# Patient Record
Sex: Female | Born: 1940 | Race: White | Hispanic: No | Marital: Married | Smoking: Former smoker
Health system: Southern US, Community
[De-identification: ages and names within clinical notes are randomized; demographics above are authoritative.]

## PROBLEM LIST (undated history)

## (undated) DIAGNOSIS — D649 Anemia, unspecified: Secondary | ICD-10-CM

## (undated) DIAGNOSIS — T4145XA Adverse effect of unspecified anesthetic, initial encounter: Secondary | ICD-10-CM

## (undated) DIAGNOSIS — R319 Hematuria, unspecified: Secondary | ICD-10-CM

## (undated) DIAGNOSIS — E039 Hypothyroidism, unspecified: Secondary | ICD-10-CM

## (undated) DIAGNOSIS — Z8249 Family history of ischemic heart disease and other diseases of the circulatory system: Secondary | ICD-10-CM

## (undated) DIAGNOSIS — Z9081 Acquired absence of spleen: Secondary | ICD-10-CM

## (undated) DIAGNOSIS — L259 Unspecified contact dermatitis, unspecified cause: Secondary | ICD-10-CM

## (undated) DIAGNOSIS — J40 Bronchitis, not specified as acute or chronic: Secondary | ICD-10-CM

## (undated) DIAGNOSIS — Z90710 Acquired absence of both cervix and uterus: Secondary | ICD-10-CM

## (undated) DIAGNOSIS — L219 Seborrheic dermatitis, unspecified: Secondary | ICD-10-CM

## (undated) DIAGNOSIS — C801 Malignant (primary) neoplasm, unspecified: Secondary | ICD-10-CM

## (undated) DIAGNOSIS — E7800 Pure hypercholesterolemia, unspecified: Secondary | ICD-10-CM

## (undated) DIAGNOSIS — M1990 Unspecified osteoarthritis, unspecified site: Secondary | ICD-10-CM

## (undated) DIAGNOSIS — E785 Hyperlipidemia, unspecified: Secondary | ICD-10-CM

## (undated) DIAGNOSIS — T8859XA Other complications of anesthesia, initial encounter: Secondary | ICD-10-CM

## (undated) HISTORY — DX: Malignant (primary) neoplasm, unspecified: C80.1

## (undated) HISTORY — DX: Family history of ischemic heart disease and other diseases of the circulatory system: Z82.49

## (undated) HISTORY — PX: FRACTURE SURGERY: SHX138

## (undated) HISTORY — DX: Acquired absence of spleen: Z90.81

## (undated) HISTORY — DX: Acquired absence of both cervix and uterus: Z90.710

## (undated) HISTORY — DX: Pure hypercholesterolemia, unspecified: E78.00

## (undated) HISTORY — DX: Hyperlipidemia, unspecified: E78.5

## (undated) HISTORY — DX: Anemia, unspecified: D64.9

## (undated) HISTORY — DX: Unspecified contact dermatitis, unspecified cause: L25.9

## (undated) HISTORY — DX: Hematuria, unspecified: R31.9

## (undated) HISTORY — PX: OTHER SURGICAL HISTORY: SHX169

## (undated) HISTORY — DX: Hypothyroidism, unspecified: E03.9

## (undated) HISTORY — PX: ABDOMINAL HYSTERECTOMY: SHX81

## (undated) HISTORY — DX: Seborrheic dermatitis, unspecified: L21.9

## (undated) HISTORY — PX: MOHS SURGERY: SUR867

## (undated) HISTORY — PX: MOUTH SURGERY: SHX715

## (undated) HISTORY — DX: Bronchitis, not specified as acute or chronic: J40

---
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
## 1990-11-05 DIAGNOSIS — Z90710 Acquired absence of both cervix and uterus: Secondary | ICD-10-CM

## 1990-11-05 HISTORY — DX: Acquired absence of both cervix and uterus: Z90.710

## 1991-11-06 HISTORY — PX: OTHER SURGICAL HISTORY: SHX169

## 1996-11-05 DIAGNOSIS — Z9081 Acquired absence of spleen: Secondary | ICD-10-CM

## 1996-11-05 HISTORY — DX: Acquired absence of spleen: Z90.81

## 2006-10-01 ENCOUNTER — Emergency Department: Payer: Self-pay | Admitting: Emergency Medicine

## 2006-10-04 ENCOUNTER — Ambulatory Visit (HOSPITAL_BASED_OUTPATIENT_CLINIC_OR_DEPARTMENT_OTHER): Admission: RE | Admit: 2006-10-04 | Discharge: 2006-10-05 | Payer: Self-pay | Admitting: Orthopedic Surgery

## 2009-02-11 DIAGNOSIS — E039 Hypothyroidism, unspecified: Secondary | ICD-10-CM

## 2009-02-11 HISTORY — DX: Hypothyroidism, unspecified: E03.9

## 2009-02-23 DIAGNOSIS — Z8249 Family history of ischemic heart disease and other diseases of the circulatory system: Secondary | ICD-10-CM

## 2009-02-23 HISTORY — DX: Family history of ischemic heart disease and other diseases of the circulatory system: Z82.49

## 2009-02-25 DIAGNOSIS — R319 Hematuria, unspecified: Secondary | ICD-10-CM

## 2009-02-25 HISTORY — DX: Hematuria, unspecified: R31.9

## 2010-03-01 DIAGNOSIS — J40 Bronchitis, not specified as acute or chronic: Secondary | ICD-10-CM

## 2010-03-01 HISTORY — DX: Bronchitis, not specified as acute or chronic: J40

## 2014-01-04 LAB — CBC AND DIFFERENTIAL
HCT: 46 % (ref 36–46)
Hemoglobin: 16.1 g/dL — AB (ref 12.0–16.0)
Platelets: 344 10*3/uL (ref 150–399)
WBC: 6.6 10^3/mL

## 2014-01-04 LAB — BASIC METABOLIC PANEL
BUN: 17 mg/dL (ref 4–21)
CREATININE: 1 mg/dL (ref <=1.1)
Glucose: 94 mg/dL
POTASSIUM: 4.3 mmol/L (ref 3.4–5.3)
SODIUM: 136 mmol/L — AB (ref 137–147)

## 2014-01-04 LAB — HEPATIC FUNCTION PANEL
ALT: 31 U/L (ref 7–35)
AST: 23 U/L (ref 13–35)

## 2014-08-09 LAB — TSH: TSH: 0.52 u[IU]/mL (ref <=5.90)

## 2015-08-04 ENCOUNTER — Encounter: Payer: Self-pay | Admitting: Family Medicine

## 2015-08-04 ENCOUNTER — Ambulatory Visit (INDEPENDENT_AMBULATORY_CARE_PROVIDER_SITE_OTHER): Payer: Medicare Other | Admitting: Family Medicine

## 2015-08-04 VITALS — BP 132/98 | HR 76 | Temp 97.5°F | Resp 16 | Ht 62.5 in | Wt 131.0 lb

## 2015-08-04 DIAGNOSIS — E039 Hypothyroidism, unspecified: Secondary | ICD-10-CM

## 2015-08-04 DIAGNOSIS — E2839 Other primary ovarian failure: Secondary | ICD-10-CM | POA: Diagnosis not present

## 2015-08-04 DIAGNOSIS — Z23 Encounter for immunization: Secondary | ICD-10-CM

## 2015-08-04 DIAGNOSIS — L219 Seborrheic dermatitis, unspecified: Secondary | ICD-10-CM | POA: Insufficient documentation

## 2015-08-04 DIAGNOSIS — Z0000 Encounter for general adult medical examination without abnormal findings: Secondary | ICD-10-CM | POA: Diagnosis not present

## 2015-08-04 DIAGNOSIS — Z1211 Encounter for screening for malignant neoplasm of colon: Secondary | ICD-10-CM | POA: Diagnosis not present

## 2015-08-04 DIAGNOSIS — E7800 Pure hypercholesterolemia, unspecified: Secondary | ICD-10-CM | POA: Insufficient documentation

## 2015-08-04 DIAGNOSIS — Z13820 Encounter for screening for osteoporosis: Secondary | ICD-10-CM | POA: Diagnosis not present

## 2015-08-04 DIAGNOSIS — D649 Anemia, unspecified: Secondary | ICD-10-CM | POA: Insufficient documentation

## 2015-08-04 DIAGNOSIS — I10 Essential (primary) hypertension: Secondary | ICD-10-CM | POA: Insufficient documentation

## 2015-08-04 NOTE — Progress Notes (Signed)
Patient: Jillian Mullins, Female    DOB: [birth year], 74 y.o.   MRN: [id number] Visit Date: 08/04/2015  Today's Provider: Margarita Rana, MD   Chief Complaint  Patient presents with  . Medicare Wellness  . Hypothyroidism   Subjective:    Annual wellness visit Jillian Mullins is a 74 y.o. female. She feels well. She reports exercising walks daily, plays golf. She reports she is sleeping well.  Reviewed health maintenance today. Has no acute concerns. Also checked on chronic issues today.    Hypothyroidism: Patient presents for evaluation of thyroid function. Symptoms consist of denies fatigue, weight changes, heat/cold intolerance, bowel/skin changes or CVS symptoms. Symptoms have present for 20 years. The symptoms are mild.  The problem has been unchanged.  Previous thyroid studies include TSH.  -----------------------------------------------------------   Review of Systems  Constitutional: Negative.   HENT: Negative.   Eyes: Negative.   Respiratory: Negative.   Cardiovascular: Negative.   Gastrointestinal: Negative.   Endocrine: Negative.   Genitourinary: Negative.   Musculoskeletal: Negative.   Skin: Negative.   Allergic/Immunologic: Negative.   Neurological: Negative.   Hematological: Negative.   Psychiatric/Behavioral: Negative.     Social History   Social History  . Marital Status: Married    Spouse Name: Kam Kushnir, Sr.  . Number of Children: 3  . Years of Education: 17   Occupational History  . RETIRED     [employer]   Social History Main Topics  . Smoking status: Former Smoker -- 0.25 packs/day for 20 years    Quit date: 11/05/1995  . Smokeless tobacco: Never Used  . Alcohol Use: 4.8 oz/week    8 Glasses of wine per week     Comment: Moderate alcohol use; Has cut back and is not a concern  . Drug Use: No  . Sexual Activity: Not on file   Other Topics Concern  . Not on file   Social History Narrative    Patient Active Problem List   Diagnosis Date Noted  . Absolute anemia 08/04/2015  . BP (high blood pressure) 08/04/2015  . Hypercholesteremia 08/04/2015  . Dermatitis seborrheica 08/04/2015  . Bronchitis 03/01/2010  . CD (contact dermatitis) 02/25/2009  . Blood in the urine 02/25/2009  . HLD (hyperlipidemia) 02/11/2009  . Adult hypothyroidism 02/11/2009  . 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 H/O total hysterectomy 11/05/1990    Past Surgical History  Procedure Laterality Date  . Splenectomy  1960  . Hysterectomy and bso  1993  . Mouth surgery      Removed all wisdom teeth  . Fracture surgery      Wrist fracture repair  . Colon polyp removal      Colonoscopy: 1970  . Abdominal hysterectomy      Her family history includes COPD in her sister; Heart attack in her father; Heart failure in her mother.    Previous Medications   LEVOTHYROXINE (SYNTHROID) 88 MCG TABLET    Take 1 tablet by mouth daily.    Patient Care Team: Margarita Rana, MD as PCP - General (Family Medicine)     Objective:   Vitals: BP 132/98 mmHg  Pulse 76  Temp(Src) 97.5 F (36.4 C) (Oral)  Resp 16  Ht 5' 2.5" (1.588 m)  Wt 131 lb (59.421 kg)  BMI 23.56 kg/m2  Physical Exam  Constitutional: She is oriented to person, place, and time. She appears well-developed and well-nourished. No distress.  HENT:  Head: Normocephalic and atraumatic.  Right Ear:  External ear normal.  Left Ear: External ear normal.  Nose: Nose normal.  Mouth/Throat: Oropharynx is clear and moist. No oropharyngeal exudate.  Eyes: Conjunctivae and EOM are normal. Pupils are equal, round, and reactive to light. Right eye exhibits no discharge. Left eye exhibits no discharge. No scleral icterus.  Neck: Normal range of motion. Neck supple. No JVD present. No tracheal deviation present. No thyromegaly present.  Cardiovascular: Normal rate, regular rhythm, normal heart sounds and intact distal pulses.  Exam reveals no gallop and no friction rub.   No murmur  heard. Pulmonary/Chest: Effort normal and breath sounds normal. No stridor. No respiratory distress. She has no wheezes. She has no rales. She exhibits no tenderness.  Abdominal: Soft. Bowel sounds are normal. She exhibits no distension and no mass. There is no tenderness. There is no rebound and no guarding.  Musculoskeletal: Normal range of motion. She exhibits no edema or tenderness.  Lymphadenopathy:    She has no cervical adenopathy.  Neurological: She is alert and oriented to person, place, and time. She has normal reflexes. She displays normal reflexes. No cranial nerve deficit. She exhibits normal muscle tone. Coordination normal.  Skin: Skin is warm and dry. No rash noted. She is not diaphoretic. No erythema. No pallor.  Psychiatric: She has a normal mood and affect. Her behavior is normal. Judgment and thought content normal.    Activities of Daily Living In your present state of health, do you have any difficulty performing the following activities: 08/04/2015  Hearing? N  Vision? N  Difficulty concentrating or making decisions? N  Walking or climbing stairs? N  Dressing or bathing? N  Doing errands, shopping? N    Fall Risk Assessment Fall Risk  08/04/2015  Falls in the past year? No     Depression Screen PHQ 2/9 Scores 08/04/2015  PHQ - 2 Score 0    Cognitive Testing - 6-CIT  Correct? Score   What year is it? yes 0 0 or 4  What month is it? yes 0 0 or 3  Memorize:    Jillian Mullins,  [birth year],  [address]  [place],      What time is it? (within 1 hour) yes 0 0 or 3  Count backwards from 20 yes 0 0, 2, or 4  Name the months of the year yes 0 0, 2, or 4  Repeat name & address above yes 0 0, 2, 4, 6, 8, or 10       TOTAL SCORE  0/28   Interpretation:  Normal  Normal (0-7) Abnormal (8-28)       Assessment & Plan:     Annual Wellness Visit  Reviewed patient's Family Medical History Reviewed and updated list of patient's medical providers Assessment of cognitive  impairment was done Assessed patient's functional ability Established a written schedule for health screening [place] Completed and Reviewed  Exercise Activities and Dietary recommendations Goals    None      Immunization History  Administered Date(s) Administered  . Influenza, High Dose Seasonal PF 08/04/2015  . Pneumococcal Conjugate-13 01/04/2014  . Pneumococcal-Unspecified 04/02/1995, 09/19/2005  . Tdap 07/18/2006    There are no preventive care reminders to display for this patient.    Discussed health benefits of physical activity, and encouraged her to engage in regular exercise appropriate for her age and condition.    ------------------------------------------------------------------------------------------------------------  1. Hypothyroidism, unspecified hypothyroidism type Stable. FU pending results. Continue current medication. Will refill as needed.  -  TSH  2. Hypercholesteremia Pt has H/O this; FU pending results. - Comprehensive metabolic panel - CBC with Differential/Platelet - Lipid panel  3. Flu vaccine need Flu vaccine administered.  - Flu vaccine HIGH DOSE PF  4. Colon cancer screening Refer to GI for colonoscopy. - Ambulatory referral to Gastroenterology  5. Encounter for screening for osteoporosis FU pending BMD. - DG Bone Density; Future  6. Estrogen deficiency FU pending BMD. - DG Bone Density; Future   7. Medicare annual wellness visit, subsequent As above. Stable.   Patient was seen and examined by Jerrell Belfast, MD, and note scribed by Renaldo Fiddler, CMA. I have reviewed the document for accuracy and completeness and I agree with above. Jerrell Belfast, MD   Margarita Rana, MD

## 2015-08-05 ENCOUNTER — Telehealth: Payer: Self-pay

## 2015-08-05 LAB — LIPID PANEL
CHOL/HDL RATIO: 4 ratio (ref 0.0–4.4)
CHOLESTEROL TOTAL: 320 mg/dL — AB (ref 100–199)
HDL: 80 mg/dL (ref >39)
LDL Calculated: 215 mg/dL — ABNORMAL HIGH (ref 0–99)
Triglycerides: 127 mg/dL (ref 0–149)
VLDL Cholesterol Cal: 25 mg/dL (ref 5–40)

## 2015-08-05 LAB — CBC WITH DIFFERENTIAL/PLATELET
BASOS ABS: 0.1 10*3/uL (ref 0.0–0.2)
BASOS: 2 %
EOS (ABSOLUTE): 0.3 10*3/uL (ref 0.0–0.4)
Eos: 5 %
Hematocrit: 47.7 % — ABNORMAL HIGH (ref 34.0–46.6)
Hemoglobin: 16.3 g/dL — ABNORMAL HIGH (ref 11.1–15.9)
IMMATURE GRANULOCYTES: 0 %
Immature Grans (Abs): 0 10*3/uL (ref 0.0–0.1)
Lymphocytes Absolute: 2.2 10*3/uL (ref 0.7–3.1)
Lymphs: 42 %
MCH: 32.7 pg (ref 26.6–33.0)
MCHC: 34.2 g/dL (ref 31.5–35.7)
MCV: 96 fL (ref 79–97)
MONOS ABS: 0.4 10*3/uL (ref 0.1–0.9)
Monocytes: 8 %
NEUTROS PCT: 43 %
Neutrophils Absolute: 2.3 10*3/uL (ref 1.4–7.0)
PLATELETS: 320 10*3/uL (ref 150–379)
RBC: 4.99 x10E6/uL (ref 3.77–5.28)
RDW: 13.9 % (ref 12.3–15.4)
WBC: 5.4 10*3/uL (ref 3.4–10.8)

## 2015-08-05 LAB — COMPREHENSIVE METABOLIC PANEL
A/G RATIO: 1.6 (ref 1.1–2.5)
ALK PHOS: 127 IU/L — AB (ref 39–117)
ALT: 25 IU/L (ref 0–32)
AST: 25 IU/L (ref 0–40)
Albumin: 4.3 g/dL (ref 3.5–4.8)
BILIRUBIN TOTAL: 0.5 mg/dL (ref 0.0–1.2)
BUN/Creatinine Ratio: 18 (ref 11–26)
BUN: 18 mg/dL (ref 8–27)
CALCIUM: 9.9 mg/dL (ref 8.7–10.3)
CHLORIDE: 101 mmol/L (ref 97–108)
CO2: 22 mmol/L (ref 18–29)
Creatinine, Ser: 0.99 mg/dL (ref 0.57–1.00)
GFR calc Af Amer: 66 mL/min/{1.73_m2} (ref >59)
GFR, EST NON AFRICAN AMERICAN: 57 mL/min/{1.73_m2} — AB (ref >59)
GLOBULIN, TOTAL: 2.7 g/dL (ref 1.5–4.5)
Glucose: 94 mg/dL (ref 65–99)
POTASSIUM: 4.8 mmol/L (ref 3.5–5.2)
SODIUM: 140 mmol/L (ref 134–144)
Total Protein: 7 g/dL (ref 6.0–8.5)

## 2015-08-05 LAB — TSH: TSH: 0.075 u[IU]/mL — AB (ref 0.450–4.500)

## 2015-08-05 NOTE — Telephone Encounter (Signed)
LMTCB Emily Drozdowski, CMA  

## 2015-08-05 NOTE — Telephone Encounter (Signed)
-----   Message from Margarita Rana, MD sent at 08/05/2015  1:06 PM EDT ----- Labs stable except thyroid slightly over corrected. Not enough to make any changes unless having symptoms of hyperthyroid like palpitations or anxiety. Also, cholesterol is very high at 320 with LDL or back cholesterol of 215. Is balanced by good at 80. 10 year risk of heart disease is 11 percent and treatment with statin is recommended. Please see if patient would like to start medication. Also,   hgb is mildly elevated. Stay well hydrated and recheck TSH, T4 and CBC in 6 weeks.  Thanks.

## 2015-08-08 NOTE — Telephone Encounter (Signed)
Advised pt of lab results. Pt verbally acknowledges understanding. Pt does not wish to start Statin at this time. Renaldo Fiddler, CMA

## 2015-08-11 ENCOUNTER — Ambulatory Visit
Admission: RE | Admit: 2015-08-11 | Discharge: 2015-08-11 | Disposition: A | Discharge status: Home / Self Care | Payer: Medicare Other | Source: Ambulatory Visit | Attending: Family Medicine | Admitting: Family Medicine

## 2015-08-11 DIAGNOSIS — M810 Age-related osteoporosis without current pathological fracture: Secondary | ICD-10-CM | POA: Insufficient documentation

## 2015-08-11 DIAGNOSIS — M8580 Other specified disorders of bone density and structure, unspecified site: Secondary | ICD-10-CM | POA: Diagnosis not present

## 2015-08-11 DIAGNOSIS — E2839 Other primary ovarian failure: Secondary | ICD-10-CM

## 2015-08-11 DIAGNOSIS — Z13820 Encounter for screening for osteoporosis: Secondary | ICD-10-CM | POA: Insufficient documentation

## 2015-08-12 ENCOUNTER — Telehealth: Payer: Self-pay

## 2015-08-12 NOTE — Telephone Encounter (Signed)
Pt returned Laura's call. Thanks TNPp m

## 2015-08-12 NOTE — Telephone Encounter (Signed)
LMTCB 08/12/2015  Thanks,   -Mickel Baas

## 2015-08-12 NOTE — Telephone Encounter (Signed)
-----   Message from Margarita Rana, MD sent at 08/12/2015  1:24 PM EDT ----- Osteoporosis. OV to address. Not emergent. Would be better to have in about 4 weeks because may be some other vaccines needed secondary to history of splenectomy.  Thanks.

## 2015-08-12 NOTE — Telephone Encounter (Signed)
Pt advised.  Made apt for 09/12/2015   Thanks,   -Mickel Baas

## 2015-08-18 ENCOUNTER — Telehealth: Payer: Self-pay

## 2015-08-18 ENCOUNTER — Other Ambulatory Visit: Payer: Self-pay

## 2015-08-18 NOTE — Telephone Encounter (Signed)
Gastroenterology Pre-Procedure Review  Request Date: 01/10/16 Requesting Physician: Dr.   PATIENT REVIEW QUESTIONS: The patient responded to the following health history questions as indicated:    1. Are you having any GI issues? no 2. Do you have a personal history of Polyps? no 3. Do you have a family history of Colon Cancer or Polyps? no 4. Diabetes Mellitus? no 5. Joint replacements in the past 12 months?no 6. Major health problems in the past 3 months?no 7. Any artificial heart valves, MVP, or defibrillator?no    MEDICATIONS & ALLERGIES:    Patient reports the following regarding taking any anticoagulation/antiplatelet therapy:   Plavix, Coumadin, Eliquis, Xarelto, Lovenox, Pradaxa, Brilinta, or Effient? no Aspirin? no  Patient confirms/reports the following medications:  Current Outpatient Prescriptions  Medication Sig Dispense Refill  . levothyroxine (SYNTHROID) 88 MCG tablet Take 1 tablet by mouth daily.     No current facility-administered medications for this visit.    Patient confirms/reports the following allergies:  Allergies  Allergen Reactions  . Tetracycline     No orders of the defined types were placed in this encounter.    AUTHORIZATION INFORMATION Primary Insurance: 1D#: Group #:  Secondary Insurance: 1D#: Group #:  SCHEDULE INFORMATION: Date:01/20/16  Time: Location: [facility]

## 2015-08-24 ENCOUNTER — Other Ambulatory Visit: Payer: Self-pay

## 2015-09-12 ENCOUNTER — Ambulatory Visit (INDEPENDENT_AMBULATORY_CARE_PROVIDER_SITE_OTHER): Payer: Medicare Other | Admitting: Family Medicine

## 2015-09-12 ENCOUNTER — Encounter: Payer: Self-pay | Admitting: Family Medicine

## 2015-09-12 VITALS — BP 112/80 | HR 60 | Temp 98.0°F | Resp 16 | Wt 133.0 lb

## 2015-09-12 DIAGNOSIS — Z23 Encounter for immunization: Secondary | ICD-10-CM | POA: Diagnosis not present

## 2015-09-12 DIAGNOSIS — M810 Age-related osteoporosis without current pathological fracture: Secondary | ICD-10-CM | POA: Diagnosis not present

## 2015-09-12 DIAGNOSIS — E559 Vitamin D deficiency, unspecified: Secondary | ICD-10-CM | POA: Diagnosis not present

## 2015-09-12 DIAGNOSIS — Z9081 Acquired absence of spleen: Secondary | ICD-10-CM | POA: Diagnosis not present

## 2015-09-12 MED ORDER — ALENDRONATE SODIUM 70 MG PO TABS: 70.0000 mg | ORAL_TABLET | ORAL | Status: DC

## 2015-09-12 MED ORDER — VITAMIN D 1000 UNITS PO TABS: 1000.0000 [IU] | ORAL_TABLET | Freq: Every day | ORAL | Status: DC

## 2015-09-12 NOTE — Progress Notes (Signed)
Subjective:    Patient ID: Jillian Mullins, female    DOB: [birth year], 74 y.o.   MRN: [id number]  HPI  Osteoporosis: Patient complains of osteoporosis. She was diagnosed with osteoporosis by bone density scan in 08/11/2015. Patient admits to history of fracture.The cause of osteoporosis is felt to be due to postmenopausal estrogen deficiency.   She is not currently being treated with calcium and vitamin D supplementation.  She is not currently being treated with bisphosphonates  Osteoporosis Risk Factors  Nonmodifiable Personal Hx of fracture as an adult: yes - after fall 6-7 years ago. Fell down stairs.   Hx of fracture in first-degree relative: yes - sister Caucasian race: yes Advanced age: yes Female sex: yes Dementia: no Poor health/frailty: no  Potentially modifiable: Tobacco use: no Low body weight (<127 lbs): no Estrogen deficiency  early menopause (age <45) or bilateral ovariectomy: no  prolonged premenopausal amenorrhea (>1 yr): not applicable Low calcium intake (lifelong): no Alcoholism: no Recurrent falls: no Inadequate physical activity: no  Immunizations Pt needs meningitis vaccines due to s/p splenectomy. Also needs Hib at follow up.   Review of Systems  Constitutional: Negative for fever, chills, diaphoresis, activity change, appetite change, fatigue and unexpected weight change.  Respiratory: Negative for cough, shortness of breath and wheezing.   Cardiovascular: Negative for chest pain, palpitations and leg swelling.   BP 112/80 mmHg  Pulse 60  Temp(Src) 98 F (36.7 C) (Oral)  Resp 16  Wt 133 lb (60.328 kg)   Patient Active Problem List   Diagnosis Date Noted  . Absolute anemia 08/04/2015  . BP (high blood pressure) 08/04/2015  . Hypercholesteremia 08/04/2015  . Dermatitis seborrheica 08/04/2015  . Bronchitis 03/01/2010  . CD (contact dermatitis) 02/25/2009  . Blood in the urine 02/25/2009  . HLD (hyperlipidemia) 02/11/2009  . Adult  hypothyroidism 02/11/2009  . 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 H/O total hysterectomy 11/05/1990   Past Medical History  Diagnosis Date  . Anemia   . Hypothyroidism 02/11/2009  . Hypercholesterolemia   . Hyperlipidemia   . Bronchitis 03/01/2010  . Seborrheic dermatitis   . Contact dermatitis   . Hematuria 02/25/2009  . Family history of cardiovascular disease 02/23/2009    (father MI 73 yo)  . 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 Post-splenectomy   . 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 levothyroxine (SYNTHROID) 88 MCG tablet Take 1 tablet by mouth daily.   No current facility-administered medications on file prior to visit.   Allergies  Allergen Reactions  . Tetracycline    Past Surgical History  Procedure Laterality Date  . Splenectomy  1960  . Hysterectomy and bso  1993  . Mouth surgery      Removed all wisdom teeth  . Fracture surgery      Wrist fracture repair  . Colon polyp removal      Colonoscopy: 1970  . Abdominal hysterectomy     Social History   Social History  . Marital Status: Married    Spouse Name: Cassity Christian, Sr.  . Number of Children: 3  . Years of Education: 17   Occupational History  . RETIRED     [employer]   Social History Main Topics  . Smoking status: Former Smoker -- 0.25 packs/day for 20 years    Quit date: 11/05/1995  . Smokeless tobacco: Never Used  . Alcohol Use: 4.8  oz/week    8 Glasses of wine per week     Comment: Moderate alcohol use; Has cut back and is not a concern  . Drug Use: No  . Sexual Activity: Not on file   Other Topics Concern  . Not on file   Social History Narrative   Family History  Problem Relation Age of Onset  . Heart failure Mother   . Heart attack Father   . COPD Sister     in her 49's      Objective:   Physical Exam  Constitutional: She is oriented to person, place, and time. She appears  well-developed and well-nourished.  Neurological: She is alert and oriented to person, place, and time.  Psychiatric: She has a normal mood and affect. Her behavior is normal. Judgment and thought content normal.   BP 112/80 mmHg  Pulse 60  Temp(Src) 98 F (36.7 C) (Oral)  Resp 16  Wt 133 lb (60.328 kg)     Assessment & Plan:  1. History of splenectomy Shots given today. Needs Men booster and Hib in 8 weeks.   - Meningococcal B, OMV - Meningococcal conjugate vaccine 4-valent IM  2. Osteoporosis 10 year risk of hip fracture is 4.9 percent and major fracture is 21 percent. Discussed risks and benefits of Fosamax. Does not need dental work. Instructed on how to take medication and importance of this. Will recheck labs in 6 weeks.    - alendronate (FOSAMAX) 70 MG tablet; Take 1 tablet (70 mg total) by mouth every 7 (seven) days. Take with a full glass of water on an empty stomach.  Dispense: 4 tablet; Refill: 11  3. Vitamin D deficiency Will start taking Vitamin D.   Margarita Rana, MD

## 2015-10-25 ENCOUNTER — Other Ambulatory Visit: Payer: Self-pay

## 2015-10-25 DIAGNOSIS — E039 Hypothyroidism, unspecified: Secondary | ICD-10-CM

## 2015-10-25 MED ORDER — LEVOTHYROXINE SODIUM 88 MCG PO TABS: 88.0000 ug | ORAL_TABLET | Freq: Every day | ORAL | Status: DC

## 2015-11-18 ENCOUNTER — Ambulatory Visit: Payer: Medicare Other | Admitting: Family Medicine

## 2015-12-09 ENCOUNTER — Ambulatory Visit: Payer: Medicare Other | Admitting: Family Medicine

## 2015-12-14 ENCOUNTER — Ambulatory Visit (INDEPENDENT_AMBULATORY_CARE_PROVIDER_SITE_OTHER): Payer: Medicare PPO | Admitting: Family Medicine

## 2015-12-14 DIAGNOSIS — Z9081 Acquired absence of spleen: Secondary | ICD-10-CM | POA: Diagnosis not present

## 2015-12-14 NOTE — Progress Notes (Signed)
Shot only. Thanks.

## 2015-12-30 ENCOUNTER — Telehealth: Payer: Self-pay | Admitting: Gastroenterology

## 2015-12-30 NOTE — Telephone Encounter (Signed)
Patient is moving and needs to change her date for colonoscopy

## 2015-12-30 NOTE — Telephone Encounter (Signed)
LVM for pt to return my call.

## 2016-01-02 ENCOUNTER — Other Ambulatory Visit: Payer: Self-pay

## 2016-01-10 ENCOUNTER — Encounter: Admission: RE | Payer: Self-pay | Source: Ambulatory Visit

## 2016-01-10 ENCOUNTER — Encounter: Payer: Self-pay | Admitting: *Deleted

## 2016-01-10 ENCOUNTER — Ambulatory Visit: Admission: RE | Admit: 2016-01-10 | Payer: Medicare PPO | Source: Ambulatory Visit | Admitting: Gastroenterology

## 2016-01-10 SURGERY — COLONOSCOPY WITH PROPOFOL
Anesthesia: Choice

## 2016-01-10 SURGERY — COLONOSCOPY WITH PROPOFOL
Anesthesia: General

## 2016-01-11 NOTE — Telephone Encounter (Signed)
Pt is in the middle of a move. Will call back when all is settled to schedule.

## 2016-06-08 ENCOUNTER — Other Ambulatory Visit: Payer: Self-pay | Admitting: Family Medicine

## 2016-06-08 DIAGNOSIS — E039 Hypothyroidism, unspecified: Secondary | ICD-10-CM

## 2016-07-17 ENCOUNTER — Other Ambulatory Visit: Payer: Self-pay | Admitting: Family Medicine

## 2016-07-17 DIAGNOSIS — E039 Hypothyroidism, unspecified: Secondary | ICD-10-CM

## 2016-07-17 MED ORDER — SYNTHROID 88 MCG PO TABS: 88.0000 ug | ORAL_TABLET | Freq: Every day | ORAL | 1 refills | Status: DC

## 2016-07-17 NOTE — Telephone Encounter (Signed)
Pt needs refill on her synthroid.  She uses [facility]  Her call back is I [phone]  Thanks Con Memos

## 2016-07-17 NOTE — Telephone Encounter (Signed)
Dr. Alben Spittle Pt.  Thanks,   -Mickel Baas

## 2016-07-18 DIAGNOSIS — H2513 Age-related nuclear cataract, bilateral: Secondary | ICD-10-CM | POA: Diagnosis not present

## 2016-08-07 ENCOUNTER — Encounter: Payer: Medicare PPO | Admitting: Family Medicine

## 2016-08-09 DIAGNOSIS — H2513 Age-related nuclear cataract, bilateral: Secondary | ICD-10-CM | POA: Diagnosis not present

## 2016-08-23 DIAGNOSIS — Z23 Encounter for immunization: Secondary | ICD-10-CM | POA: Diagnosis not present

## 2016-10-10 ENCOUNTER — Ambulatory Visit (INDEPENDENT_AMBULATORY_CARE_PROVIDER_SITE_OTHER): Payer: Medicare PPO | Admitting: Family Medicine

## 2016-10-10 ENCOUNTER — Encounter: Payer: Self-pay | Admitting: Family Medicine

## 2016-10-10 VITALS — BP 148/84 | HR 74 | Temp 97.5°F | Resp 16 | Ht 62.0 in | Wt 132.0 lb

## 2016-10-10 DIAGNOSIS — Z0000 Encounter for general adult medical examination without abnormal findings: Secondary | ICD-10-CM

## 2016-10-10 DIAGNOSIS — E7800 Pure hypercholesterolemia, unspecified: Secondary | ICD-10-CM

## 2016-10-10 DIAGNOSIS — S60519D Abrasion of unspecified hand, subsequent encounter: Secondary | ICD-10-CM | POA: Diagnosis not present

## 2016-10-10 DIAGNOSIS — Z9081 Acquired absence of spleen: Secondary | ICD-10-CM

## 2016-10-10 DIAGNOSIS — I10 Essential (primary) hypertension: Secondary | ICD-10-CM | POA: Diagnosis not present

## 2016-10-10 DIAGNOSIS — Z23 Encounter for immunization: Secondary | ICD-10-CM

## 2016-10-10 DIAGNOSIS — Z1211 Encounter for screening for malignant neoplasm of colon: Secondary | ICD-10-CM

## 2016-10-10 LAB — IFOBT (OCCULT BLOOD): IMMUNOLOGICAL FECAL OCCULT BLOOD TEST: NEGATIVE

## 2016-10-10 NOTE — Progress Notes (Signed)
Patient: Jillian Mullins, Female    DOB: 06/10/1941, 75 y.o.   MRN: [id number] Visit Date: 10/10/2016  Today's Provider: Wilhemena Durie, MD   Chief Complaint  Patient presents with  . Medicare Wellness   Subjective:   Jillian Mullins is a 75 y.o. female who presents today for her Subsequent Annual Wellness Visit. She feels well. She reports exercising daily. She reports she is sleeping well.  Colonoscopy- was scheduled for 01/10/16 but pt cancelled. BMD- 08/11/15 some osteoporosis Mammogram-1996, pt does not usually get these as she is not a big believer.  Pap- pt has had hysterectomy  Immunization History  Administered Date(s) Administered  . Influenza, High Dose Seasonal PF 08/04/2015  . Meningococcal B, OMV 09/12/2015, 12/14/2015  . Meningococcal Conjugate 09/12/2015  . Pneumococcal Conjugate-13 01/04/2014  . Pneumococcal Polysaccharide-23 04/02/1995, 09/19/2005  . Tdap 07/18/2006   Pt has h/o splenectomy and will need a Td and Hib (per Dr. Sharyon Medicus last note, was not given). Pt had flu vaccine at pharmacy at the beginning of the fall.   Review of Systems  Constitutional: Negative.   HENT: Negative.   Eyes: Negative.   Respiratory: Negative.   Cardiovascular: Negative.   Gastrointestinal: Negative.   Endocrine: Negative.   Genitourinary: Negative.   Musculoskeletal: Negative.   Skin: Negative.   Allergic/Immunologic: Negative.   Neurological: Negative.   Hematological: Negative.   Psychiatric/Behavioral: Negative.     Patient Active Problem List   Diagnosis Date Noted  . Absolute anemia 08/04/2015  . BP (high blood pressure) 08/04/2015  . Hypercholesteremia 08/04/2015  . Dermatitis seborrheica 08/04/2015  . Bronchitis 03/01/2010  . CD (contact dermatitis) 02/25/2009  . Blood in the urine 02/25/2009  . HLD (hyperlipidemia) 02/11/2009  . Adult hypothyroidism 02/11/2009  . 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 H/O total hysterectomy 11/05/1990    Social History    Social History  . Marital status: Married    Spouse name: Lucindia Lathe, Sr.  . Number of children: 3  . Years of education: 45   Occupational History  . RETIRED     [employer]   Social History Main Topics  . Smoking status: Former Smoker    Packs/day: 0.25    Years: 20.00    Quit date: 11/05/1995  . Smokeless tobacco: Never Used  . Alcohol use 4.8 oz/week    8 Glasses of wine per week     Comment: Moderate alcohol use; Has cut back and is not a concern  . Drug use: No  . Sexual activity: Not on file   Other Topics Concern  . Not on file   Social History Narrative  . No narrative on file    Past Surgical History:  Procedure Laterality Date  . ABDOMINAL HYSTERECTOMY    . Colon Polyp Removal     Colonoscopy: 1970  . FRACTURE SURGERY     Wrist fracture repair  . Hysterectomy and BSO  1993  . MOUTH SURGERY     Removed all wisdom teeth  . SPLENECTOMY  1960    Her family history includes COPD in her sister; Heart attack in her father; Heart failure in her mother.     Outpatient Medications Prior to Visit  Medication Sig Dispense Refill  . aspirin 325 MG tablet Take 325 mg by mouth 2 (two) times a week. Not consistent, may take 4 a week.    Marland Kitchen SYNTHROID 88 MCG tablet Take 1 tablet (88 mcg total) by mouth daily. 90 tablet 1  .  alendronate (FOSAMAX) 70 MG tablet Take 1 tablet (70 mg total) by mouth every 7 (seven) days. Take with a full glass of water on an empty stomach. (Patient not taking: Reported on 10/10/2016) 4 tablet 11  . cholecalciferol (VITAMIN D) 1000 UNITS tablet Take 1 tablet (1,000 Units total) by mouth daily. (Patient not taking: Reported on 10/10/2016) 1 tablet 0   No facility-administered medications prior to visit.     Allergies  Allergen Reactions  . Tetracycline     Patient Care Team: Jerrol Banana., MD as PCP - General (Family Medicine)   Objective:   Vitals:  Vitals:   10/10/16 1041  BP: (!) 148/84  Pulse: 74  Resp: 16  Temp: 97.5  F (36.4 C)  TempSrc: Oral  Weight: 132 lb (59.9 kg)  Height: 5\' 2"  (1.575 m)    Physical Exam  Constitutional: She is oriented to person, place, and time. She appears well-developed and well-nourished.  HENT:  Head: Normocephalic and atraumatic.  Right Ear: External ear normal.  Left Ear: External ear normal.  Nose: Nose normal.  Mouth/Throat: Oropharynx is clear and moist.  Eyes: Conjunctivae and EOM are normal. Pupils are equal, round, and reactive to light.  Neck: Normal range of motion. Neck supple.  Cardiovascular: Normal rate, normal heart sounds and intact distal pulses.   Pulmonary/Chest: Effort normal and breath sounds normal.  Normal breast exam.  Abdominal: Soft. Bowel sounds are normal.  Genitourinary: Rectal exam shows guaiac negative stool.  Genitourinary Comments: Normal external genitalia. DRE normal.  Musculoskeletal: Normal range of motion.  Neurological: She is alert and oriented to person, place, and time. She has normal reflexes.  Skin: Skin is warm and dry.  Psychiatric: She has a normal mood and affect. Her behavior is normal. Judgment and thought content normal.    Activities of Daily Living In your present state of health, do you have any difficulty performing the following activities: 10/10/2016  Hearing? N  Vision? Y  Difficulty concentrating or making decisions? N  Walking or climbing stairs? N  Dressing or bathing? N  Doing errands, shopping? N  Some recent data might be hidden    Fall Risk Assessment Fall Risk  10/10/2016 08/04/2015  Falls in the past year? No No     Depression Screen PHQ 2/9 Scores 10/10/2016 08/04/2015  PHQ - 2 Score 0 0    Cognitive Testing - 6-CIT    Year: 0 points  Month: 0 points  Memorize "Pia Mau, [address], [place]"  Time (within 1 hour:) 0 points  Count backwards from 20: 1 points  Name months of year: 0 points  Repeat Address: 2 points   Total Score: 3 /28  Interpretation : Normal (0-7) Abnormal  (8-28)    Assessment & Plan:     Annual Wellness Visit  Reviewed patient's Family Medical History Reviewed and updated list of patient's medical providers Assessment of cognitive impairment was done Assessed patient's functional ability Established a written schedule for health screening [place] Completed and Reviewed  Exercise Activities and Dietary recommendations Goals    None      Immunization History  Administered Date(s) Administered  . Influenza, High Dose Seasonal PF 08/04/2015  . Meningococcal B, OMV 09/12/2015, 12/14/2015  . Meningococcal Conjugate 09/12/2015  . Pneumococcal Conjugate-13 01/04/2014  . Pneumococcal Polysaccharide-23 04/02/1995, 09/19/2005  . Tdap 07/18/2006    Health Maintenance  Topic Date Due  . MAMMOGRAM  10/03/1992  . COLONOSCOPY  10/03/1992  .  ZOSTAVAX  10/03/2002  . PNA vac Low Risk Adult (2 of 2 - PPSV23) 01/05/2015  . INFLUENZA VACCINE  06/05/2016  . TETANUS/TDAP  07/18/2016  . DEXA SCAN  Completed    Pt  Has declined most health maintenance through the years.She continues to decline these--no mammogram or colonoscopy. Discussed health benefits of physical activity, and encouraged her to engage in regular exercise appropriate for her age and condition.   1. Medicare annual wellness visit, subsequent   2. Hypertension, unspecified type  - CBC with Differential/Platelet - TSH  3. Pure hypercholesterolemia  - Lipid Panel With LDL/HDL Ratio - Comprehensive metabolic panel  4. Need for TD vaccine  - Td : Tetanus/diphtheria >7yo Preservative  free  5. H/O splenectomy Td given today. Pt UTD on other vaccines except for Hib, which we do not have in the office. Plan to order a box and call pt to have the vaccine in office.  6.Abrasions on hands Update tetanus. Miguel Aschoff MD [facility] 10/10/2016 10:54  AM  ------------------------------------------------------------------------------------------------------------

## 2016-10-11 LAB — CBC WITH DIFFERENTIAL/PLATELET
BASOS ABS: 0.1 10*3/uL (ref 0.0–0.2)
BASOS: 2 %
EOS (ABSOLUTE): 0.2 10*3/uL (ref 0.0–0.4)
Eos: 3 %
Hematocrit: 47.1 % — ABNORMAL HIGH (ref 34.0–46.6)
Hemoglobin: 16.5 g/dL — ABNORMAL HIGH (ref 11.1–15.9)
IMMATURE GRANULOCYTES: 0 %
Immature Grans (Abs): 0 10*3/uL (ref 0.0–0.1)
Lymphocytes Absolute: 2.1 10*3/uL (ref 0.7–3.1)
Lymphs: 35 %
MCH: 33.6 pg — ABNORMAL HIGH (ref 26.6–33.0)
MCHC: 35 g/dL (ref 31.5–35.7)
MCV: 96 fL (ref 79–97)
MONOS ABS: 0.6 10*3/uL (ref 0.1–0.9)
Monocytes: 10 %
NEUTROS PCT: 50 %
Neutrophils Absolute: 3.1 10*3/uL (ref 1.4–7.0)
PLATELETS: 316 10*3/uL (ref 150–379)
RBC: 4.91 x10E6/uL (ref 3.77–5.28)
RDW: 13.1 % (ref 12.3–15.4)
WBC: 6.1 10*3/uL (ref 3.4–10.8)

## 2016-10-11 LAB — TSH: TSH: 1.06 u[IU]/mL (ref 0.450–4.500)

## 2016-10-11 LAB — COMPREHENSIVE METABOLIC PANEL
ALT: 19 IU/L (ref 0–32)
AST: 25 IU/L (ref 0–40)
Albumin/Globulin Ratio: 1.8 (ref 1.2–2.2)
Albumin: 4.6 g/dL (ref 3.5–4.8)
Alkaline Phosphatase: 103 IU/L (ref 39–117)
BUN/Creatinine Ratio: 16 (ref 12–28)
BUN: 17 mg/dL (ref 8–27)
Bilirubin Total: 0.5 mg/dL (ref 0.0–1.2)
CALCIUM: 9.8 mg/dL (ref 8.7–10.3)
CO2: 23 mmol/L (ref 18–29)
CREATININE: 1.05 mg/dL — AB (ref 0.57–1.00)
Chloride: 100 mmol/L (ref 96–106)
GFR calc Af Amer: 60 mL/min/{1.73_m2} (ref >59)
GFR, EST NON AFRICAN AMERICAN: 52 mL/min/{1.73_m2} — AB (ref >59)
Globulin, Total: 2.5 g/dL (ref 1.5–4.5)
Glucose: 88 mg/dL (ref 65–99)
Potassium: 4.5 mmol/L (ref 3.5–5.2)
Sodium: 139 mmol/L (ref 134–144)
Total Protein: 7.1 g/dL (ref 6.0–8.5)

## 2016-10-11 LAB — LIPID PANEL WITH LDL/HDL RATIO
CHOLESTEROL TOTAL: 347 mg/dL — AB (ref 100–199)
HDL: 85 mg/dL (ref >39)
LDL Calculated: 240 mg/dL — ABNORMAL HIGH (ref 0–99)
LDL/HDL RATIO: 2.8 ratio (ref 0.0–3.2)
Triglycerides: 110 mg/dL (ref 0–149)
VLDL Cholesterol Cal: 22 mg/dL (ref 5–40)

## 2016-10-12 ENCOUNTER — Telehealth: Payer: Self-pay | Admitting: Emergency Medicine

## 2016-10-12 MED ORDER — ROSUVASTATIN CALCIUM 20 MG PO TABS: 10.0000 mg | ORAL_TABLET | Freq: Every day | ORAL | 12 refills | Status: DC

## 2016-10-12 NOTE — Telephone Encounter (Signed)
-----   Message from Jerrol Banana., MD sent at 10/11/2016  3:56 PM EST ----- Cholesterol significantly elevated--consider Crestor 20mg  daily.OV and labs 1 month.

## 2016-10-12 NOTE — Telephone Encounter (Signed)
Pt informed and voiced understanding of results. Med sent in.  

## 2016-11-30 DIAGNOSIS — H2513 Age-related nuclear cataract, bilateral: Secondary | ICD-10-CM | POA: Diagnosis not present

## 2016-11-30 DIAGNOSIS — L239 Allergic contact dermatitis, unspecified cause: Secondary | ICD-10-CM | POA: Diagnosis not present

## 2016-12-28 DIAGNOSIS — H2513 Age-related nuclear cataract, bilateral: Secondary | ICD-10-CM | POA: Diagnosis not present

## 2017-01-01 ENCOUNTER — Encounter: Payer: Self-pay | Admitting: *Deleted

## 2017-01-15 MED ORDER — CYCLOPENTOLATE HCL 2 % OP SOLN
1.0000 [drp] | OPHTHALMIC | Status: AC
  Administered 2017-01-16 (×4): 1 [drp] via OPHTHALMIC

## 2017-01-15 MED ORDER — MOXIFLOXACIN HCL 0.5 % OP SOLN: 1.0000 [drp] | OPHTHALMIC | Status: AC

## 2017-01-15 MED ORDER — PHENYLEPHRINE HCL 10 % OP SOLN
1.0000 [drp] | OPHTHALMIC | Status: AC
  Administered 2017-01-16 (×4): 1 [drp] via OPHTHALMIC

## 2017-01-15 NOTE — H&P (Signed)
See scanned note.

## 2017-01-16 ENCOUNTER — Ambulatory Visit: Payer: Medicare PPO | Admitting: Certified Registered"

## 2017-01-16 ENCOUNTER — Encounter: Payer: Self-pay | Admitting: *Deleted

## 2017-01-16 ENCOUNTER — Ambulatory Visit
Admission: RE | Admit: 2017-01-16 | Discharge: 2017-01-16 | Disposition: A | Discharge status: Home / Self Care | Payer: Medicare PPO | Source: Ambulatory Visit | Attending: Ophthalmology | Admitting: Ophthalmology

## 2017-01-16 ENCOUNTER — Encounter
Admission: RE | Disposition: A | Discharge status: Home / Self Care | Payer: Self-pay | Source: Ambulatory Visit | Attending: Ophthalmology

## 2017-01-16 DIAGNOSIS — Z87891 Personal history of nicotine dependence: Secondary | ICD-10-CM | POA: Insufficient documentation

## 2017-01-16 DIAGNOSIS — D649 Anemia, unspecified: Secondary | ICD-10-CM | POA: Insufficient documentation

## 2017-01-16 DIAGNOSIS — I10 Essential (primary) hypertension: Secondary | ICD-10-CM | POA: Insufficient documentation

## 2017-01-16 DIAGNOSIS — H2513 Age-related nuclear cataract, bilateral: Secondary | ICD-10-CM | POA: Diagnosis not present

## 2017-01-16 DIAGNOSIS — E039 Hypothyroidism, unspecified: Secondary | ICD-10-CM | POA: Diagnosis not present

## 2017-01-16 DIAGNOSIS — H2511 Age-related nuclear cataract, right eye: Secondary | ICD-10-CM | POA: Insufficient documentation

## 2017-01-16 HISTORY — DX: Unspecified osteoarthritis, unspecified site: M19.90

## 2017-01-16 HISTORY — PX: CATARACT EXTRACTION W/PHACO: SHX586

## 2017-01-16 SURGERY — PHACOEMULSIFICATION, CATARACT, WITH IOL INSERTION
Anesthesia: Monitor Anesthesia Care | Laterality: Right

## 2017-01-16 MED ORDER — HYALURONIDASE HUMAN 150 UNIT/ML IJ SOLN
INTRAMUSCULAR | Status: AC
  Filled 2017-01-16: qty 1

## 2017-01-16 MED ORDER — NA CHONDROIT SULF-NA HYALURON 40-17 MG/ML IO SOLN
INTRAOCULAR | Status: AC
  Filled 2017-01-16: qty 1

## 2017-01-16 MED ORDER — PROPARACAINE HCL 0.5 % OP SOLN
OPHTHALMIC | Status: AC
  Filled 2017-01-16: qty 15

## 2017-01-16 MED ORDER — LIDOCAINE HCL (PF) 2 % IJ SOLN
INTRAMUSCULAR | Status: AC
  Filled 2017-01-16: qty 2

## 2017-01-16 MED ORDER — MOXIFLOXACIN HCL 0.5 % OP SOLN
1.0000 [drp] | OPHTHALMIC | 0 refills | Status: DC
Start: 2017-01-16 — End: 2017-02-04

## 2017-01-16 MED ORDER — EPINEPHRINE PF 1 MG/ML IJ SOLN
INTRAMUSCULAR | Status: AC
  Filled 2017-01-16: qty 2

## 2017-01-16 MED ORDER — MIDAZOLAM HCL 2 MG/2ML IJ SOLN
INTRAMUSCULAR | Status: DC | PRN
  Administered 2017-01-16: 1 mg via INTRAVENOUS

## 2017-01-16 MED ORDER — BUPIVACAINE HCL (PF) 0.75 % IJ SOLN
INTRAMUSCULAR | Status: AC
  Filled 2017-01-16: qty 10

## 2017-01-16 MED ORDER — ONDANSETRON HCL 4 MG/2ML IJ SOLN: 4.0000 mg | Freq: Once | INTRAMUSCULAR | Status: DC | PRN

## 2017-01-16 MED ORDER — TETRACAINE HCL 0.5 % OP SOLN
OPHTHALMIC | Status: DC | PRN
  Administered 2017-01-16: 2 [drp] via OPHTHALMIC

## 2017-01-16 MED ORDER — MOXIFLOXACIN HCL 0.5 % OP SOLN
1.0000 [drp] | OPHTHALMIC | Status: AC
  Administered 2017-01-16 (×3): 1 [drp] via OPHTHALMIC

## 2017-01-16 MED ORDER — CYCLOPENTOLATE HCL 2 % OP SOLN
OPHTHALMIC | Status: AC
  Administered 2017-01-16: 1 [drp] via OPHTHALMIC
  Filled 2017-01-16: qty 2

## 2017-01-16 MED ORDER — CEFUROXIME OPHTHALMIC INJECTION 1 MG/0.1 ML
INJECTION | OPHTHALMIC | Status: DC | PRN
  Administered 2017-01-16: 0.1 mL via INTRACAMERAL

## 2017-01-16 MED ORDER — POVIDONE-IODINE 5 % OP SOLN
OPHTHALMIC | Status: AC
  Filled 2017-01-16: qty 30

## 2017-01-16 MED ORDER — SODIUM CHLORIDE 0.9 % IV SOLN
INTRAVENOUS | Status: DC
  Administered 2017-01-16 (×2): via INTRAVENOUS

## 2017-01-16 MED ORDER — MOXIFLOXACIN HCL 0.5 % OP SOLN
OPHTHALMIC | Status: AC
  Administered 2017-01-16: 1 [drp] via OPHTHALMIC
  Filled 2017-01-16: qty 3

## 2017-01-16 MED ORDER — EPINEPHRINE PF 1 MG/ML IJ SOLN
INTRAMUSCULAR | Status: DC | PRN
  Administered 2017-01-16: 250 mL via OPHTHALMIC

## 2017-01-16 MED ORDER — CEFUROXIME OPHTHALMIC INJECTION 1 MG/0.1 ML
INJECTION | OPHTHALMIC | Status: AC
  Filled 2017-01-16: qty 0.1

## 2017-01-16 MED ORDER — MIDAZOLAM HCL 2 MG/2ML IJ SOLN
INTRAMUSCULAR | Status: AC
  Filled 2017-01-16: qty 2

## 2017-01-16 MED ORDER — PROPARACAINE HCL 0.5 % OP SOLN
OPHTHALMIC | Status: DC | PRN
  Administered 2017-01-16: 2 [drp] via OPHTHALMIC

## 2017-01-16 MED ORDER — LIDOCAINE HCL (PF) 4 % IJ SOLN
INTRAMUSCULAR | Status: DC | PRN
  Administered 2017-01-16: 1 mL via OPHTHALMIC

## 2017-01-16 MED ORDER — NA CHONDROIT SULF-NA HYALURON 40-17 MG/ML IO SOLN
INTRAOCULAR | Status: DC | PRN
  Administered 2017-01-16: 1 mL via INTRAOCULAR

## 2017-01-16 MED ORDER — LIDOCAINE HCL (PF) 4 % IJ SOLN
INTRAMUSCULAR | Status: DC | PRN
  Administered 2017-01-16: 5 mL via OPHTHALMIC

## 2017-01-16 MED ORDER — PHENYLEPHRINE HCL 10 % OP SOLN
OPHTHALMIC | Status: AC
  Administered 2017-01-16: 1 [drp] via OPHTHALMIC
  Filled 2017-01-16: qty 5

## 2017-01-16 MED ORDER — TETRACAINE HCL 0.5 % OP SOLN
OPHTHALMIC | Status: AC
  Filled 2017-01-16: qty 2

## 2017-01-16 MED ORDER — FENTANYL CITRATE (PF) 100 MCG/2ML IJ SOLN: 25.0000 ug | INTRAMUSCULAR | Status: DC | PRN

## 2017-01-16 MED ORDER — MOXIFLOXACIN HCL 0.5 % OP SOLN
OPHTHALMIC | Status: DC | PRN
  Administered 2017-01-16: 2 [drp]

## 2017-01-16 MED ORDER — CARBACHOL 0.01 % IO SOLN
INTRAOCULAR | Status: DC | PRN
  Administered 2017-01-16: 0.5 mL via INTRAOCULAR

## 2017-01-16 MED ORDER — ALFENTANIL 500 MCG/ML IJ INJ
INJECTION | INTRAMUSCULAR | Status: DC | PRN
  Administered 2017-01-16: 250 ug via INTRAVENOUS

## 2017-01-16 SURGICAL SUPPLY — 34 items
CANNULA ANT/CHMB 27GA (MISCELLANEOUS) ×3 IMPLANT
CORD BIP STRL DISP 12FT (MISCELLANEOUS) ×3 IMPLANT
CUP MEDICINE 2OZ PLAST GRAD ST (MISCELLANEOUS) ×3 IMPLANT
DRAPE XRAY CASSETTE 23X24 (DRAPES) ×3 IMPLANT
ERASER HMR WETFIELD 18G (MISCELLANEOUS) ×3 IMPLANT
GLOVE BIO SURGEON STRL SZ8 (GLOVE) ×3 IMPLANT
GLOVE SURG LX 6.5 MICRO (GLOVE) ×2
GLOVE SURG LX 8.0 MICRO (GLOVE) ×2
GLOVE SURG LX STRL 6.5 MICRO (GLOVE) ×1 IMPLANT
GLOVE SURG LX STRL 8.0 MICRO (GLOVE) ×1 IMPLANT
GOWN STRL REUS W/ TWL LRG LVL3 (GOWN DISPOSABLE) ×1 IMPLANT
GOWN STRL REUS W/ TWL XL LVL3 (GOWN DISPOSABLE) ×1 IMPLANT
GOWN STRL REUS W/TWL LRG LVL3 (GOWN DISPOSABLE) ×2
GOWN STRL REUS W/TWL XL LVL3 (GOWN DISPOSABLE) ×2
LENS IOL ACRSF IQ TRC 4 19.0 ×1 IMPLANT
LENS IOL ACRYSOF IQ TORIC 19.0 ×2 IMPLANT
LENS IOL IQ TORIC 4 19.0 ×1 IMPLANT
MARKER PEN SURG W/LABELS VIOL (MISCELLANEOUS) ×6 IMPLANT
PACK CATARACT (MISCELLANEOUS) ×3 IMPLANT
PACK CATARACT DINGLEDEIN LX (MISCELLANEOUS) ×3 IMPLANT
PACK EYE AFTER SURG (MISCELLANEOUS) ×3 IMPLANT
SHLD EYE VISITEC  UNIV (MISCELLANEOUS) ×3 IMPLANT
SOL BAL SALT 15ML (MISCELLANEOUS) ×3
SOL BSS BAG (MISCELLANEOUS) ×3
SOL PREP PVP 2OZ (MISCELLANEOUS) ×3
SOLUTION BAL SALT 15ML (MISCELLANEOUS) ×1 IMPLANT
SOLUTION BSS BAG (MISCELLANEOUS) ×1 IMPLANT
SOLUTION PREP PVP 2OZ (MISCELLANEOUS) ×1 IMPLANT
SUT SILK 5-0 (SUTURE) ×3 IMPLANT
SYR 3ML LL SCALE MARK (SYRINGE) ×3 IMPLANT
SYR 5ML LL (SYRINGE) ×3 IMPLANT
SYR TB 1ML 27GX1/2 LL (SYRINGE) ×3 IMPLANT
WATER STERILE IRR 250ML POUR (IV SOLUTION) ×3 IMPLANT
WIPE NON LINTING 3.25X3.25 (MISCELLANEOUS) ×3 IMPLANT

## 2017-01-16 NOTE — Discharge Instructions (Signed)
See handout Eye Surgery Discharge Instructions  Expect mild scratchy sensation or mild soreness. DO NOT RUB YOUR EYE!  The day of surgery:  Minimal physical activity, but bed rest is not required  No reading, computer work, or close hand work  No bending, lifting, or straining.  May watch TV  For 24 hours:  No driving, legal decisions, or alcoholic beverages  Safety precautions  Eat anything you prefer: It is better to start with liquids, then soup then solid foods.  _____ Eye patch should be worn until postoperative exam tomorrow.  ____ Solar shield eyeglasses should be worn for comfort in the sunlight/patch while sleeping  Resume all regular medications including aspirin or Coumadin if these were discontinued prior to surgery. You may shower, bathe, shave, or wash your hair. Tylenol may be taken for mild discomfort.  Call your doctor if you experience significant pain, nausea, or vomiting, fever > 101 or other signs of infection. [phone] or [phone] Specific instructions:  Follow-up Information    DINGELDEIN,STEVEN, MD Follow up on 01/17/2017.   Specialty:  Ophthalmology Why:  10:10 am Contact information: [address]   [place] [address] [phone]

## 2017-01-16 NOTE — Transfer of Care (Signed)
Immediate Anesthesia Transfer of Care Note  Patient: Jillian Mullins  Procedure(s) Performed: Procedure(s) with comments: CATARACT EXTRACTION PHACO AND INTRAOCULAR LENS PLACEMENT (IOC) (Right) - [place] 1:27 AP  26.1 CDE  37.97 fluid casette lot #[id number] H  exp  05/04/2018   Patient Location: PACU  Anesthesia Type:MAC  Level of Consciousness: awake, alert  and oriented  Airway & Oxygen Therapy: Patient Spontanous Breathing  Post-op Assessment: Report given to RN and Post -op Vital signs reviewed and stable  Post vital signs: Reviewed and stable  Last Vitals:  Vitals:   01/16/17 0802 01/16/17 0913  BP: (!) 166/85 (!) 174/111  Pulse: 65   Resp: 16 11  Temp:      Last Pain:  Vitals:   01/16/17 0644  TempSrc: Tympanic  PainSc: 1          Complications: No apparent anesthesia complications

## 2017-01-16 NOTE — Progress Notes (Signed)
Dr.  Micah Flesher (anesthesia) ordered an EKG which was performed. EKG was read by Dr. Micah Flesher and he did discuss the results with the patient and her husband. Dr. recommended to the patient that she schedule an appointment with her PCP to discuss the results of the EKG. Patient acknowledges understanding.

## 2017-01-16 NOTE — Anesthesia Postprocedure Evaluation (Signed)
Anesthesia Post Note  Patient: Jillian Mullins  Procedure(s) Performed: Procedure(s) (LRB): CATARACT EXTRACTION PHACO AND INTRAOCULAR LENS PLACEMENT (IOC) (Right)  Patient location during evaluation: PACU Anesthesia Type: MAC Level of consciousness: awake, awake and alert and oriented Pain management: pain level controlled Vital Signs Assessment: post-procedure vital signs reviewed and stable Respiratory status: spontaneous breathing, nonlabored ventilation and respiratory function stable Cardiovascular status: blood pressure returned to baseline Anesthetic complications: no     Last Vitals:  Vitals:   01/16/17 0802 01/16/17 0913  BP: (!) 166/85 (!) 174/111  Pulse: 65   Resp: 16 11  Temp:      Last Pain:  Vitals:   01/16/17 0644  TempSrc: Tympanic  PainSc: [place]

## 2017-01-16 NOTE — Anesthesia Preprocedure Evaluation (Signed)
Anesthesia Evaluation  Patient identified by MRN, date of birth, ID band Patient awake    Reviewed: Allergy & Precautions, NPO status   Airway Mallampati: II       Dental  (+) Teeth Intact   Pulmonary neg pulmonary ROS, former smoker,    breath sounds clear to auscultation       Cardiovascular Exercise Tolerance: Good hypertension,  Rhythm:Regular     Neuro/Psych negative neurological ROS     GI/Hepatic Neg liver ROS,   Endo/Other  Hypothyroidism   Renal/GU negative Renal ROS     Musculoskeletal   Abdominal Normal abdominal exam  (+)   Peds  Hematology  (+) anemia ,   Anesthesia Other Findings   Reproductive/Obstetrics                             Anesthesia Physical Anesthesia Plan  ASA: II  Anesthesia Plan: MAC   Post-op Pain Management:    Induction: Intravenous  Airway Management Planned: Natural Airway and Nasal Cannula  Additional Equipment:   Intra-op Plan:   Post-operative Plan:   Informed Consent: I have reviewed the patients History and Physical, chart, labs and discussed the procedure including the risks, benefits and alternatives for the proposed anesthesia with the patient or authorized representative who has indicated his/her understanding and acceptance.     Plan Discussed with: CRNA  Anesthesia Plan Comments:         Anesthesia Quick Evaluation

## 2017-01-16 NOTE — Interval H&P Note (Signed)
History and Physical Interval Note:  01/16/2017 7:28 AM  Jillian Mullins  has presented today for surgery, with the diagnosis of CATARACT  The various methods of treatment have been discussed with the patient and family. After consideration of risks, benefits and other options for treatment, the patient has consented to  Procedure(s): CATARACT EXTRACTION PHACO AND INTRAOCULAR LENS PLACEMENT ([place]) (Right) as a surgical intervention .  The patient's history has been reviewed, patient examined, no change in status, stable for surgery.  I have reviewed the patient's chart and labs.  Questions were answered to the patient's satisfaction.     [name]

## 2017-01-16 NOTE — Anesthesia Procedure Notes (Signed)
Procedure Name: MAC Performed by: Lance Muss Pre-anesthesia Checklist: Patient identified, Emergency Drugs available, Suction available, Timeout performed and Patient being monitored Oxygen Delivery Method: Nasal cannula

## 2017-01-16 NOTE — Op Note (Signed)
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 Item Serial No. Manufacturer Lot No. LRB No. Used  LENS IOL TORIC 19.0 - [id number]   LENS IOL TORIC 19.0 [id number] ALCON   Right 1   Anesthesia: 2% Lidocaine and 4% Marcaine in a 50/50 mixture with 10 unites/ml of Hylenex given as a peribulbar Anesthesiologist: Anesthesiologist: Iver Nestle, MD CRNA: Lance Muss, CRNA Complications: none Estimated Blood Loss: less than 1 ml  Description of procedure:  The patient sat upright and the eyes were anesthetized with topical proparacaine. With the patient fixing at a distant target the 3 o'clock and 9 o'clock positions at the limbus were marked with an sterile indelible surgical marking pen.  The patient was given anesthesia and sedation via intravenous access. The patient was then prepped and draped in the usual fashion. A 25-gauge needle was bent to use for starting the capsulorhexis. A 5-0 silk suture was placed through the conjunctiva superior and inferiorly to serve as bridle sutures.   The [place] system was engaged and registration was performed. The positions of the incisions and the steep axis of the astigmatism were identified by the [place] system and marked with an indelible pen. The Verion heads up display was turned off.  Hemostasis was obtained at the the position of the main incision using an eraser cautery. A partial thickness groove was made at the at that location with a 64 Beaver blade and this was dissected anteriorly with an Avaya. The anterior chamber was entered at 10 o'clock with a 1.0 mm paracentesis knife and through the lamellar dissection with a 2.6 mm  Alcon keratome. Epi-Shugarcaine 0.5 CC [9 cc BSS Plus (Alcon), 3 cc 4% preservative-free lidocaine (Hospira) and 4 cc 1:1000 preservative-free, bisulfite-free epinephrine] was injected into the anterior chamber via the paracentesis tract. DiscoVisc was injected to replace the aqueous and a continuous tear curvilinear capsulorhexis was performed using a bent 25-gauge needle.  Balance salt on a syringe was used to perform hydro-dissection and phacoemulsification was carried out using a divide and conquer technique. Procedure(s) with comments: CATARACT EXTRACTION PHACO AND INTRAOCULAR LENS PLACEMENT (IOC) (Right) - [place] 1:27 AP  26.1 CDE  37.97 fluid casette lot #[id number] H  exp  05/04/2018 . Irrigation/aspiration was used to remove the residual cortex and the capsular bag was inflated with DiscoVisc. The intraocular lens was inserted into the capsular bag using a Monarch Delivery System cartridge. The Verion heads up display was turned on and the lens was rotated so that the marks on the base of the haptics were aligned with the steep axis of astigmatism as identified by the [place] unit.   Irrigation/aspiration was used to remove the residual DiscoVisc. The I/A hand piece was pressed down on top of the lens to prevent rotation. The wound was inflated with balanced salt and checked for leaks. None were found. The position of the Toric lens was reconfirmed using the [place] unit. Miostat was injected via the paracentesis track and 0.1 ml of cefuroxime containing 1 mg of drug was injected via the paracentesis track. The wound was checked for leaks again and none were found.   The bridal sutures were removed and two drops of Vigamox were placed on the eye. An eye shield was placed to  protect the eye and the patient was discharged to the recovery area in good condition.   Nahsir Venezia MD @T @

## 2017-01-16 NOTE — Anesthesia Post-op Follow-up Note (Cosign Needed)
Anesthesia QCDR form completed.        

## 2017-02-04 ENCOUNTER — Ambulatory Visit (INDEPENDENT_AMBULATORY_CARE_PROVIDER_SITE_OTHER): Payer: Medicare PPO | Admitting: Family Medicine

## 2017-02-04 VITALS — BP 164/82 | HR 68 | Temp 97.6°F | Resp 16 | Wt 133.0 lb

## 2017-02-04 DIAGNOSIS — T148XXA Other injury of unspecified body region, initial encounter: Secondary | ICD-10-CM

## 2017-02-04 NOTE — Progress Notes (Signed)
Jillian Mullins  MRN: [id number] DOB: 26-Feb-1941  Subjective:  HPI   The patient is a 76 year old female who presents to have a wound checked.  She states she was out waling with her grandchildren yesterday when a small to medium sized dog, unprovoked bit the patient and her son.  She was seen at the [facility], which is where she resides, and the nurse there has been dressing the wound. The patient is up to date on her Tetanus vaccine, she received Td on 10/10/16. The dog has been reported by the owner to be up to date on his vaccines.  The dog is currently being quarentiened and will be until 02/13/17.  Patient Active Problem List   Diagnosis Date Noted  . Absolute anemia 08/04/2015  . BP (high blood pressure) 08/04/2015  . Hypercholesteremia 08/04/2015  . Dermatitis seborrheica 08/04/2015  . Bronchitis 03/01/2010  . CD (contact dermatitis) 02/25/2009  . Blood in the urine 02/25/2009  . HLD (hyperlipidemia) 02/11/2009  . Adult hypothyroidism 02/11/2009  . 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 H/O total hysterectomy 11/05/1990    Past Medical History:  Diagnosis Date  . 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 Anemia   . Arthritis   . Bronchitis 03/01/2010  . Contact dermatitis   . Family history of cardiovascular disease 02/23/2009   (father MI 52 yo)  . 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 Hematuria 02/25/2009  . Hypercholesterolemia   . Hyperlipidemia   . Hypothyroidism 02/11/2009  . Post-splenectomy   . Seborrheic dermatitis     Social History   Social History  . Marital status: Married    Spouse name: Jillian Irigoyen, Sr.  . Number of children: 3  . Years of education: 59   Occupational History  . RETIRED     [employer]   Social History Main Topics  . Smoking status: Former Smoker    Packs/day: 0.25    Years: 20.00    Quit date: 11/05/1995  . Smokeless tobacco: Never Used  . Alcohol use 4.8 oz/week    8 Glasses of wine per week   Comment: Moderate alcohol use; Has cut back and is not a concern  . Drug use: No  . Sexual activity: Not on file   Other Topics Concern  . Not on file   Social History Narrative  . No narrative on file    Outpatient Encounter Prescriptions as of 02/04/2017  Medication Sig Note  . aspirin 325 MG tablet Take 325 mg by mouth every Monday, Wednesday, and Friday.    Marland Kitchen SYNTHROID 88 MCG tablet Take 1 tablet (88 mcg total) by mouth daily. (Patient taking differently: Take 88 mcg by mouth daily at 3 pm. )   . [DISCONTINUED] ILEVRO 0.3 % ophthalmic suspension Place 1 drop into the right eye daily. 01/11/2017: To start 01/14/17  . [DISCONTINUED] moxifloxacin (VIGAMOX) 0.5 % ophthalmic solution Place 1 drop into the right eye every 15 (fifteen) minutes.    No facility-administered encounter medications on file as of 02/04/2017.     Allergies  Allergen Reactions  . Tetracycline Other (See Comments)    Thrombocytopenia purpura  . Other Rash    Many topical products cause terrible rash.    Review of Systems  Constitutional: Negative for chills, diaphoresis and fever.  Respiratory: Negative for cough, shortness of breath and wheezing.   Cardiovascular: Negative for chest pain, palpitations and orthopnea.  Gastrointestinal: Negative for nausea.  Skin: Negative for rash.       No sign of infection at the site of the wound.  Endo/Heme/Allergies: Negative.   Psychiatric/Behavioral: Negative.     Objective:  BP (!) 164/82 (BP Location: Right Arm, Patient Position: Sitting, Cuff Size: Normal)   Pulse 68   Temp 97.6 F (36.4 C) (Oral)   Resp 16   Wt 133 lb (60.3 kg)   BMI 24.33 kg/m   Physical Exam  Constitutional: She is well-developed, well-nourished, and in no distress.  HENT:  Head: Normocephalic and atraumatic.  Cardiovascular: Normal rate and regular rhythm.   Pulmonary/Chest: Effort normal.  Abdominal: Soft.  Skin: Skin is warm and dry.  2.5 inch circular bruiise with 1 inch  superficial laceration  In outer portion. This is in left lower quadrant of abdomen.  Psychiatric: Mood, memory, affect and judgment normal.    Assessment and Plan :  dogbite of lower abdomen with superficial laceration and bruising Keep clean withj soap and water. Dog is quarantined and shots are up to date.  I have done the exam and reviewed the chart and it is accurate to the best of my knowledge. Development worker, community has been used and  any errors in dictation or transcription are unintentional. Jillian Mullins M.D. [facility]

## 2017-02-07 DIAGNOSIS — H2512 Age-related nuclear cataract, left eye: Secondary | ICD-10-CM | POA: Diagnosis not present

## 2017-02-11 ENCOUNTER — Ambulatory Visit (INDEPENDENT_AMBULATORY_CARE_PROVIDER_SITE_OTHER): Payer: Medicare PPO | Admitting: Family Medicine

## 2017-02-11 ENCOUNTER — Encounter: Payer: Self-pay | Admitting: Family Medicine

## 2017-02-11 VITALS — BP 148/92 | HR 78 | Temp 97.5°F | Resp 16 | Wt 132.0 lb

## 2017-02-11 DIAGNOSIS — R9431 Abnormal electrocardiogram [ECG] [EKG]: Secondary | ICD-10-CM | POA: Diagnosis not present

## 2017-02-11 DIAGNOSIS — I493 Ventricular premature depolarization: Secondary | ICD-10-CM

## 2017-02-11 DIAGNOSIS — S31159D Open bite of abdominal wall, unspecified quadrant without penetration into peritoneal cavity, subsequent encounter: Secondary | ICD-10-CM

## 2017-02-11 NOTE — Progress Notes (Signed)
       Patient: Jillian Mullins Female    DOB: 1941-07-10   76 y.o.   MRN: [id number] Visit Date: 02/11/2017  Today's Provider: Wilhemena Durie, MD   Chief Complaint  Patient presents with  . Abnormal EKG   Subjective:    HPI   Abnormal EKG Pt had cataract extraction of right eye performed on 01/16/2017. Pt's EKG at that time showed PVC's. Pt's anesthesiologist advised her to see her PCP for further evaluation, and to get another EKG. Pt has another cataract extraction surgery to be performed on 02/27/2017 by Dr. Sandra Cockayne. Denies lightheadedness, syncope, presyncope, chest pain, SOB.   Dog Bite FU Pt was last seen for this problem on 02/04/2017. Pt would like to have her wound checked.  Allergies  Allergen Reactions  . Tetracycline Other (See Comments)    Thrombocytopenia purpura  . Other Rash    Many topical products cause terrible rash.     Current Outpatient Prescriptions:  .  aspirin 325 MG tablet, Take 325 mg by mouth every Monday, Wednesday, and Friday. , Disp: , Rfl:  .  SYNTHROID 88 MCG tablet, Take 1 tablet (88 mcg total) by mouth daily. (Patient taking differently: Take 88 mcg by mouth daily at 3 pm. ), Disp: 90 tablet, Rfl: 1  Review of Systems  Constitutional: Negative for activity change, appetite change, chills, diaphoresis, fatigue, fever and unexpected weight change.  Respiratory: Negative for shortness of breath.   Cardiovascular: Negative for chest pain, palpitations and leg swelling.    Social History  Substance Use Topics  . Smoking status: Former Smoker    Packs/day: 0.25    Years: 20.00    Quit date: 11/05/1995  . Smokeless tobacco: Never Used  . Alcohol use 4.8 oz/week    8 Glasses of wine per week     Comment: Moderate alcohol use; Has cut back and is not a concern   Objective:   BP (!) 148/92 (BP Location: Left Arm, Patient Position: Sitting, Cuff Size: Normal)   Pulse 78   Temp 97.5 F (36.4 C) (Oral)   Resp 16   Wt 132 lb (59.9  kg)   SpO2 96%   BMI 24.14 kg/m  Vitals:   02/11/17 1129  BP: (!) 148/92  Pulse: 78  Resp: 16  Temp: 97.5 F (36.4 C)  TempSrc: Oral  SpO2: 96%  Weight: 132 lb (59.9 kg)     Physical Exam  Constitutional: She appears well-developed and well-nourished.  Cardiovascular: Normal rate, regular rhythm and normal heart sounds.   Pulmonary/Chest: Effort normal and breath sounds normal. No respiratory distress.  Musculoskeletal: She exhibits no edema.  Psychiatric: She has a normal mood and affect. Her behavior is normal.        Assessment & Plan:     1. Abnormal EKG EKG WNL today. - EKG 12-Lead  2. PVC's (premature ventricular contractions) Asymptomatic. Pt declines cardiology referral. Advised pt of symptoms of PVC's, and to call office if sx occur.  3. Animal bite of abdomen, subsequent encounter Wound healing well.    Patient seen and examined by Miguel Aschoff, MD, and note scribed by Renaldo Fiddler, CMA. I have done the exam and reviewed the above chart and it is accurate to the best of my knowledge. Development worker, community has been used in this note in any air is in the dictation or transcription are unintentional.  Wilhemena Durie, MD  [place]

## 2017-02-12 ENCOUNTER — Encounter: Payer: Self-pay | Admitting: *Deleted

## 2017-02-22 DIAGNOSIS — L814 Other melanin hyperpigmentation: Secondary | ICD-10-CM | POA: Diagnosis not present

## 2017-02-22 DIAGNOSIS — L820 Inflamed seborrheic keratosis: Secondary | ICD-10-CM | POA: Diagnosis not present

## 2017-02-22 DIAGNOSIS — L538 Other specified erythematous conditions: Secondary | ICD-10-CM | POA: Diagnosis not present

## 2017-02-22 DIAGNOSIS — X32XXXA Exposure to sunlight, initial encounter: Secondary | ICD-10-CM | POA: Diagnosis not present

## 2017-02-26 MED ORDER — CYCLOPENTOLATE HCL 2 % OP SOLN: 1.0000 [drp] | OPHTHALMIC | Status: AC

## 2017-02-26 MED ORDER — MOXIFLOXACIN HCL 0.5 % OP SOLN
1.0000 [drp] | OPHTHALMIC | Status: AC
  Administered 2017-02-27 (×3): 1 [drp] via OPHTHALMIC

## 2017-02-26 MED ORDER — PHENYLEPHRINE HCL 10 % OP SOLN
1.0000 [drp] | OPHTHALMIC | Status: AC
  Administered 2017-02-27 (×4): 1 [drp] via OPHTHALMIC

## 2017-02-26 NOTE — H&P (Signed)
See scanned note.

## 2017-02-27 ENCOUNTER — Ambulatory Visit: Payer: Medicare PPO | Admitting: Anesthesiology

## 2017-02-27 ENCOUNTER — Encounter: Payer: Self-pay | Admitting: *Deleted

## 2017-02-27 ENCOUNTER — Encounter
Admission: RE | Disposition: A | Discharge status: Home / Self Care | Payer: Self-pay | Source: Ambulatory Visit | Attending: Ophthalmology

## 2017-02-27 ENCOUNTER — Ambulatory Visit
Admission: RE | Admit: 2017-02-27 | Discharge: 2017-02-27 | Disposition: A | Discharge status: Home / Self Care | Payer: Medicare PPO | Source: Ambulatory Visit | Attending: Ophthalmology | Admitting: Ophthalmology

## 2017-02-27 DIAGNOSIS — Z87891 Personal history of nicotine dependence: Secondary | ICD-10-CM | POA: Insufficient documentation

## 2017-02-27 DIAGNOSIS — D649 Anemia, unspecified: Secondary | ICD-10-CM | POA: Insufficient documentation

## 2017-02-27 DIAGNOSIS — E039 Hypothyroidism, unspecified: Secondary | ICD-10-CM | POA: Insufficient documentation

## 2017-02-27 DIAGNOSIS — H2512 Age-related nuclear cataract, left eye: Secondary | ICD-10-CM | POA: Diagnosis not present

## 2017-02-27 HISTORY — PX: CATARACT EXTRACTION W/PHACO: SHX586

## 2017-02-27 HISTORY — DX: Other complications of anesthesia, initial encounter: T88.59XA

## 2017-02-27 HISTORY — DX: Adverse effect of unspecified anesthetic, initial encounter: T41.45XA

## 2017-02-27 SURGERY — PHACOEMULSIFICATION, CATARACT, WITH IOL INSERTION
Anesthesia: Monitor Anesthesia Care | Site: Eye | Laterality: Left | Wound class: Clean

## 2017-02-27 MED ORDER — NA CHONDROIT SULF-NA HYALURON 40-17 MG/ML IO SOLN
INTRAOCULAR | Status: DC | PRN
  Administered 2017-02-27: 1 mL via INTRAOCULAR

## 2017-02-27 MED ORDER — MIDAZOLAM HCL 2 MG/2ML IJ SOLN
INTRAMUSCULAR | Status: AC
  Filled 2017-02-27: qty 2

## 2017-02-27 MED ORDER — LIDOCAINE HCL (PF) 2 % IJ SOLN
INTRAMUSCULAR | Status: AC
  Filled 2017-02-27: qty 2

## 2017-02-27 MED ORDER — TETRACAINE HCL 0.5 % OP SOLN
OPHTHALMIC | Status: DC | PRN
  Administered 2017-02-27: 1 [drp] via OPHTHALMIC

## 2017-02-27 MED ORDER — MOXIFLOXACIN HCL 0.5 % OP SOLN
OPHTHALMIC | Status: AC
  Administered 2017-02-27: 1 [drp] via OPHTHALMIC
  Filled 2017-02-27: qty 3

## 2017-02-27 MED ORDER — SODIUM CHLORIDE 0.9 % IV SOLN
INTRAVENOUS | Status: DC
  Administered 2017-02-27: 08:00:00 via INTRAVENOUS

## 2017-02-27 MED ORDER — POVIDONE-IODINE 5 % OP SOLN
OPHTHALMIC | Status: AC
  Filled 2017-02-27: qty 30

## 2017-02-27 MED ORDER — BUPIVACAINE HCL (PF) 0.75 % IJ SOLN
INTRAMUSCULAR | Status: AC
  Filled 2017-02-27: qty 10

## 2017-02-27 MED ORDER — EPINEPHRINE PF 1 MG/ML IJ SOLN
INTRAMUSCULAR | Status: AC
  Filled 2017-02-27: qty 2

## 2017-02-27 MED ORDER — POVIDONE-IODINE 5 % OP SOLN
OPHTHALMIC | Status: DC | PRN
  Administered 2017-02-27: 1 via OPHTHALMIC

## 2017-02-27 MED ORDER — MIDAZOLAM HCL 2 MG/2ML IJ SOLN
INTRAMUSCULAR | Status: DC | PRN
  Administered 2017-02-27: 1 mg via INTRAVENOUS

## 2017-02-27 MED ORDER — HYALURONIDASE HUMAN 150 UNIT/ML IJ SOLN
INTRAMUSCULAR | Status: AC
  Filled 2017-02-27: qty 1

## 2017-02-27 MED ORDER — PROPARACAINE HCL 0.5 % OP SOLN
OPHTHALMIC | Status: AC
  Filled 2017-02-27: qty 15

## 2017-02-27 MED ORDER — EPINEPHRINE PF 1 MG/ML IJ SOLN
INTRAMUSCULAR | Status: DC | PRN
  Administered 2017-02-27: 09:00:00 via OPHTHALMIC

## 2017-02-27 MED ORDER — LIDOCAINE HCL (PF) 4 % IJ SOLN
INTRAMUSCULAR | Status: DC | PRN
  Administered 2017-02-27: 9 mL via OPHTHALMIC

## 2017-02-27 MED ORDER — ALFENTANIL 500 MCG/ML IJ INJ
INJECTION | INTRAVENOUS | Status: DC | PRN
  Administered 2017-02-27 (×2): 250 ug via INTRAVENOUS

## 2017-02-27 MED ORDER — LIDOCAINE HCL (PF) 4 % IJ SOLN
INTRAMUSCULAR | Status: DC | PRN
  Administered 2017-02-27: 4 mL via OPHTHALMIC

## 2017-02-27 MED ORDER — TETRACAINE HCL 0.5 % OP SOLN
OPHTHALMIC | Status: AC
  Filled 2017-02-27: qty 2

## 2017-02-27 MED ORDER — CEFUROXIME OPHTHALMIC INJECTION 1 MG/0.1 ML
INJECTION | OPHTHALMIC | Status: AC
  Filled 2017-02-27: qty 0.1

## 2017-02-27 MED ORDER — CARBACHOL 0.01 % IO SOLN
INTRAOCULAR | Status: DC | PRN
  Administered 2017-02-27: 0.5 mL via INTRAOCULAR

## 2017-02-27 MED ORDER — CYCLOPENTOLATE HCL 2 % OP SOLN
OPHTHALMIC | Status: AC
  Filled 2017-02-27: qty 2

## 2017-02-27 MED ORDER — PROPARACAINE HCL 0.5 % OP SOLN
OPHTHALMIC | Status: DC | PRN
  Administered 2017-02-27: 1 [drp] via OPHTHALMIC

## 2017-02-27 MED ORDER — CEFUROXIME OPHTHALMIC INJECTION 1 MG/0.1 ML
INJECTION | OPHTHALMIC | Status: DC | PRN
  Administered 2017-02-27: 1 mg via INTRACAMERAL

## 2017-02-27 MED ORDER — PHENYLEPHRINE HCL 2.5 % OP SOLN
1.0000 [drp] | Freq: Once | OPHTHALMIC | Status: DC
  Filled 2017-02-27: qty 2

## 2017-02-27 MED ORDER — CYCLOPENTOLATE HCL 2 % OP SOLN
1.0000 [drp] | Freq: Once | OPHTHALMIC | Status: AC
  Administered 2017-02-27 (×4): 1 [drp] via OPHTHALMIC

## 2017-02-27 MED ORDER — PHENYLEPHRINE HCL 10 % OP SOLN
OPHTHALMIC | Status: AC
  Administered 2017-02-27: 1 [drp] via OPHTHALMIC
  Filled 2017-02-27: qty 5

## 2017-02-27 MED ORDER — NA CHONDROIT SULF-NA HYALURON 40-17 MG/ML IO SOLN
INTRAOCULAR | Status: AC
  Filled 2017-02-27: qty 1

## 2017-02-27 SURGICAL SUPPLY — 31 items
CANNULA ANT/CHMB 27GA (MISCELLANEOUS) ×3 IMPLANT
CORD BIP STRL DISP 12FT (MISCELLANEOUS) ×3 IMPLANT
CUP MEDICINE 2OZ PLAST GRAD ST (MISCELLANEOUS) ×3 IMPLANT
DRAPE XRAY CASSETTE 23X24 (DRAPES) ×3 IMPLANT
ERASER HMR WETFIELD 18G (MISCELLANEOUS) ×3 IMPLANT
GLOVE BIO SURGEON STRL SZ8 (GLOVE) ×3 IMPLANT
GLOVE SURG LX 6.5 MICRO (GLOVE) ×2
GLOVE SURG LX 8.0 MICRO (GLOVE) ×2
GLOVE SURG LX STRL 6.5 MICRO (GLOVE) ×1 IMPLANT
GLOVE SURG LX STRL 8.0 MICRO (GLOVE) ×1 IMPLANT
GOWN STRL REUS W/ TWL LRG LVL3 (GOWN DISPOSABLE) ×1 IMPLANT
GOWN STRL REUS W/ TWL XL LVL3 (GOWN DISPOSABLE) ×1 IMPLANT
GOWN STRL REUS W/TWL LRG LVL3 (GOWN DISPOSABLE) ×2
GOWN STRL REUS W/TWL XL LVL3 (GOWN DISPOSABLE) ×2
LENS IOL ACRSF IQ TRC 3 19.5 ×1 IMPLANT
LENS IOL ACRYSOF IQ TORIC 19.5 ×2 IMPLANT
LENS IOL IQ TORIC 3 19.5 ×1 IMPLANT
PACK CATARACT (MISCELLANEOUS) ×3 IMPLANT
PACK CATARACT DINGLEDEIN LX (MISCELLANEOUS) ×3 IMPLANT
PACK EYE AFTER SURG (MISCELLANEOUS) ×3 IMPLANT
SHLD EYE VISITEC  UNIV (MISCELLANEOUS) ×3 IMPLANT
SOL BSS BAG (MISCELLANEOUS) ×3
SOL PREP PVP 2OZ (MISCELLANEOUS) ×3
SOLUTION BSS BAG (MISCELLANEOUS) ×1 IMPLANT
SOLUTION PREP PVP 2OZ (MISCELLANEOUS) ×1 IMPLANT
SUT SILK 5-0 (SUTURE) ×3 IMPLANT
SYR 3ML LL SCALE MARK (SYRINGE) ×3 IMPLANT
SYR 5ML LL (SYRINGE) ×3 IMPLANT
SYR TB 1ML 27GX1/2 LL (SYRINGE) ×3 IMPLANT
WATER STERILE IRR 250ML POUR (IV SOLUTION) ×3 IMPLANT
WIPE NON LINTING 3.25X3.25 (MISCELLANEOUS) ×3 IMPLANT

## 2017-02-27 NOTE — Interval H&P Note (Signed)
History and Physical Interval Note:  02/27/2017 9:09 AM  Jillian Mullins  has presented today for surgery, with the diagnosis of NUCLEAR SCLEROTIC CATARACT LEFT EYE  The various methods of treatment have been discussed with the patient and family. After consideration of risks, benefits and other options for treatment, the patient has consented to  Procedure(s) with comments: CATARACT EXTRACTION PHACO AND INTRAOCULAR LENS PLACEMENT (IOC) (Left) - [place] AP% CDE FLUID PACK LOT # [id number] as a surgical intervention .  The patient's history has been reviewed, patient examined, no change in status, stable for surgery.  I have reviewed the patient's chart and labs.  Questions were answered to the patient's satisfaction.     Jillian Mullins

## 2017-02-27 NOTE — Anesthesia Post-op Follow-up Note (Cosign Needed)
Anesthesia QCDR form completed.        

## 2017-02-27 NOTE — Op Note (Signed)
Date of Surgery: 02/27/2017 Date of Dictation: 02/27/2017 9:54 AM Pre-operative Diagnosis:Nuclear Sclerotic Cataract and Cortical Cataract left Eye Post-operative Diagnosis: same Procedure performed: Extra-capsular Cataract Extraction (ECCE) with placement of a posterior chamber intraocular lens (IOL) left Eye IOL:  Implant Name Type Inv. Item Serial No. Manufacturer Lot No. LRB No. Used  LENS IOL TORIC 19.5 - [id number]   LENS IOL TORIC 19.5 [id number] ALCON   Left 1   Anesthesia: 2% Lidocaine and 4% Marcaine in a 50/50 mixture with 10 unites/ml of Hylenex given as a peribulbar Anesthesiologist: Anesthesiologist: Gunnar Fusi, MD CRNA: Jonna Clark, CRNA Complications: none Estimated Blood Loss: less than 1 ml  Description of procedure:  The patient sat upright and the eyes were anesthetized with topical proparacaine. With the patient fixing at a distant target the 3 o'clock and 9 o'clock positions at the limbus were marked with an sterile indelible surgical marking pen.  The patient was given anesthesia and sedation via intravenous access. The patient was then prepped and draped in the usual fashion. A 25-gauge needle was bent to use for starting the capsulorhexis. A 5-0 silk suture was placed through the conjunctiva superior and inferiorly to serve as bridle sutures.   The [place] system was engaged and registration was performed. The positions of the incisions and the steep axis of the astigmatism were identified by the [place] system and marked with an indelible pen. The Verion heads up display was turned off.  Hemostasis was obtained at the the position of the main incision using an eraser cautery. A partial thickness groove was made at the at that location with a 64 Beaver blade and this was dissected anteriorly with an Avaya. The anterior chamber was entered at 10 o'clock with a 1.0 mm paracentesis knife and through the lamellar dissection with a 2.6 mm Alcon  keratome. Epi-Shugarcaine 0.5 CC [9 cc BSS Plus (Alcon), 3 cc 4% preservative-free lidocaine (Hospira) and 4 cc 1:1000 preservative-free, bisulfite-free epinephrine] was injected into the anterior chamber via the paracentesis tract. DiscoVisc was injected to replace the aqueous and a continuous tear curvilinear capsulorhexis was performed using a bent 25-gauge needle.  Balance salt on a syringe was used to perform hydro-dissection and phacoemulsification was carried out using a divide and conquer technique. Procedure(s) with comments: CATARACT EXTRACTION PHACO AND INTRAOCULAR LENS PLACEMENT (IOC) (Left) - [place] 1:17.6 AP% 24.9 CDE 36.05 FLUID PACK LOT # [id number]. Irrigation/aspiration was used to remove the residual cortex and the capsular bag was inflated with DiscoVisc. The intraocular lens was inserted into the capsular bag using a Monarch Delivery System cartridge. The Verion heads up display was turned on and the lens was rotated so that the marks on the base of the haptics were aligned with the steep axis of astigmatism as identified by the [place] unit.   Irrigation/aspiration was used to remove the residual DiscoVisc. The I/A hand piece was pressed down on top of the lens to prevent rotation. The wound was inflated with balanced salt and checked for leaks. None were found. The position of the Toric lens was reconfirmed using the [place] unit. Miostat was injected via the paracentesis track and 0.1 ml of cefuroxime containing 1 mg of drug was injected via the paracentesis track. The wound was checked for leaks again and none were found.   The bridal sutures were removed and two drops of Vigamox were placed on the eye. An eye shield was placed to protect the eye and the patient was  discharged to the recovery area in good condition.   Claudia Alvizo MD @T @

## 2017-02-27 NOTE — Transfer of Care (Signed)
Immediate Anesthesia Transfer of Care Note  Patient: Jillian Mullins  Procedure(s) Performed: Procedure(s) with comments: CATARACT EXTRACTION PHACO AND INTRAOCULAR LENS PLACEMENT (IOC) (Left) - [place] 1:17.6 AP% 24.9 CDE 36.05 FLUID PACK LOT # [id number]  Patient Location: PACU and Short Stay  Anesthesia Type:MAC  Level of Consciousness: awake, alert  and oriented  Airway & Oxygen Therapy: Patient Spontanous Breathing  Post-op Assessment: Report given to RN and Post -op Vital signs reviewed and stable  Post vital signs: Reviewed and stable  Last Vitals:  Vitals:   02/27/17 0742 02/27/17 0956  BP: (!) 174/111 (!) 178/85  Pulse: 77 85  Resp: 16 16  Temp: (!) 35.7 C 36.7 C    Last Pain:  Vitals:   02/27/17 0742  TempSrc: Tympanic  PainSc: 0-No pain         Complications: No apparent anesthesia complications

## 2017-02-27 NOTE — Discharge Instructions (Signed)
Eye Surgery Discharge Instructions  Expect mild scratchy sensation or mild soreness. DO NOT RUB YOUR EYE!  The day of surgery:  Minimal physical activity, but bed rest is not required  No reading, computer work, or close hand work  No bending, lifting, or straining.  May watch TV  For 24 hours:  No driving, legal decisions, or alcoholic beverages  Safety precautions  Eat anything you prefer: It is better to start with liquids, then soup then solid foods.  _____ Eye patch should be worn until postoperative exam tomorrow.  ____ Solar shield eyeglasses should be worn for comfort in the sunlight/patch while sleeping  Resume all regular medications including aspirin or Coumadin if these were discontinued prior to surgery. You may shower, bathe, shave, or wash your hair. Tylenol may be taken for mild discomfort.  Call your doctor if you experience significant pain, nausea, or vomiting, fever > 101 or other signs of infection. [phone] or [phone] Specific instructions:  Follow-up Information    Carolann Brazell, MD Follow up.   Specialty:  Ophthalmology Why:  April 26 at 10:30am Contact information: [address]   [place] [address] [phone]

## 2017-02-27 NOTE — Anesthesia Preprocedure Evaluation (Signed)
Anesthesia Evaluation  Patient identified by MRN, date of birth, ID band Patient awake    Reviewed: Allergy & Precautions, NPO status , Patient's Chart, lab work & pertinent test results  History of Anesthesia Complications (+) history of anesthetic complications (PVC's, "cardiologist said that was normal")  Airway Mallampati: III       Dental   Pulmonary neg pulmonary ROS, former smoker,           Cardiovascular hypertension (pt denies),      Neuro/Psych negative neurological ROS     GI/Hepatic negative GI ROS, Neg liver ROS,   Endo/Other  Hypothyroidism   Renal/GU negative Renal ROS     Musculoskeletal   Abdominal   Peds  Hematology  (+) anemia ,   Anesthesia Other Findings   Reproductive/Obstetrics                             Anesthesia Physical Anesthesia Plan  ASA: II  Anesthesia Plan: MAC   Post-op Pain Management:    Induction: Intravenous  Airway Management Planned: Nasal Cannula  Additional Equipment:   Intra-op Plan:   Post-operative Plan:   Informed Consent: I have reviewed the patients History and Physical, chart, labs and discussed the procedure including the risks, benefits and alternatives for the proposed anesthesia with the patient or authorized representative who has indicated his/her understanding and acceptance.     Plan Discussed with:   Anesthesia Plan Comments:         Anesthesia Quick Evaluation

## 2017-02-27 NOTE — Anesthesia Postprocedure Evaluation (Signed)
Anesthesia Post Note  Patient: Jillian Mullins  Procedure(s) Performed: Procedure(s) (LRB): CATARACT EXTRACTION PHACO AND INTRAOCULAR LENS PLACEMENT (IOC) (Left)  Patient location during evaluation: Short Stay Anesthesia Type: MAC Level of consciousness: awake, awake and alert and oriented Pain management: pain level controlled Vital Signs Assessment: post-procedure vital signs reviewed and stable Respiratory status: spontaneous breathing Cardiovascular status: stable Postop Assessment: no headache and adequate PO intake Anesthetic complications: no     Last Vitals:  Vitals:   02/27/17 0953 02/27/17 0956  BP: (!) 178/85 (!) 178/85  Pulse: 75 85  Resp: 18 16  Temp: 36.4 C 36.7 C    Last Pain:  Vitals:   02/27/17 0742  TempSrc: Tympanic  PainSc: 0-No pain                 Lanora Manis

## 2017-06-06 DIAGNOSIS — L239 Allergic contact dermatitis, unspecified cause: Secondary | ICD-10-CM | POA: Diagnosis not present

## 2017-06-21 ENCOUNTER — Other Ambulatory Visit: Payer: Self-pay | Admitting: Family Medicine

## 2017-06-21 DIAGNOSIS — E039 Hypothyroidism, unspecified: Secondary | ICD-10-CM

## 2017-07-25 ENCOUNTER — Other Ambulatory Visit: Payer: Self-pay | Admitting: Family Medicine

## 2017-08-02 ENCOUNTER — Telehealth: Payer: Self-pay | Admitting: Family Medicine

## 2017-08-02 DIAGNOSIS — Z7184 Encounter for health counseling related to travel: Secondary | ICD-10-CM

## 2017-08-02 MED ORDER — AZITHROMYCIN 250 MG PO TABS: ORAL_TABLET | ORAL | 0 refills | Status: DC

## 2017-08-02 NOTE — Telephone Encounter (Signed)
Sent in, -[employer], RMA

## 2017-08-02 NOTE — Telephone Encounter (Signed)
Please review-Jillian Mullins, RMA  

## 2017-08-02 NOTE — Telephone Encounter (Signed)
Pt called saying she had the pharmacy [facility] to send in for her to have aZpaK.  She said she gets this for travel coming up for 15 days.  She said Dr, Venia Minks Always gave her a rx.  Her call back is [phone]  Pt know Dr. Rosanna Randy wont be back in til  Monday  Thank sTeri

## 2017-08-02 NOTE — Telephone Encounter (Signed)
A Zpak for travel ok.

## 2017-12-06 ENCOUNTER — Telehealth: Payer: Self-pay

## 2017-12-06 NOTE — Telephone Encounter (Signed)
LMTCB and r/s AWV with [facility]. Pt cancelled last apt on 12/02/17. -MM

## 2017-12-10 DIAGNOSIS — Z961 Presence of intraocular lens: Secondary | ICD-10-CM | POA: Diagnosis not present

## 2017-12-25 ENCOUNTER — Telehealth: Payer: Self-pay

## 2017-12-25 NOTE — Telephone Encounter (Signed)
LMTCB and schedule AWV.  -MM 

## 2018-01-28 NOTE — Telephone Encounter (Signed)
This encounter was created in error - please disregard.

## 2018-02-03 ENCOUNTER — Telehealth: Payer: Self-pay

## 2018-02-03 NOTE — Telephone Encounter (Signed)
LMTCB and schedule AWV.  -MM 

## 2018-02-12 NOTE — Telephone Encounter (Signed)
Tried to contact pt on several different occasions and left VMs to CB and pt has not CB. Closing encounter. -MM

## 2018-07-11 ENCOUNTER — Other Ambulatory Visit: Payer: Self-pay | Admitting: Family Medicine

## 2018-08-14 ENCOUNTER — Other Ambulatory Visit: Payer: Self-pay | Admitting: Family Medicine

## 2018-08-18 ENCOUNTER — Other Ambulatory Visit: Payer: Self-pay | Admitting: Family Medicine

## 2018-08-18 DIAGNOSIS — E039 Hypothyroidism, unspecified: Secondary | ICD-10-CM

## 2018-08-18 NOTE — Telephone Encounter (Signed)
Please review. Thanks!  

## 2018-08-18 NOTE — Telephone Encounter (Signed)
Pt requesting refill of SYNTHROID 88 MCG tablet sent to [facility] on [address]

## 2018-09-09 ENCOUNTER — Other Ambulatory Visit: Payer: Self-pay | Admitting: Family Medicine

## 2018-09-16 ENCOUNTER — Other Ambulatory Visit: Payer: Self-pay | Admitting: Family Medicine

## 2018-09-16 NOTE — Telephone Encounter (Signed)
I will not be here on the day she has her physical scheduled on New Year's Eve.  Also it is been almost 2 years since she had any lab work.  She needs a visit before she can have any refills.  She can see anyone.

## 2018-09-16 NOTE — Telephone Encounter (Signed)
Pt needing a refill or enough to get her through until her CPE w/ Rosanna Randy   SYNTHROID 88 MCG tablet  Please fill at:  [place], [place] - [address] [phone] (Phone) [phone] (Fax)   Thanks, [facility]

## 2018-09-17 DIAGNOSIS — X32XXXA Exposure to sunlight, initial encounter: Secondary | ICD-10-CM | POA: Diagnosis not present

## 2018-09-17 DIAGNOSIS — L821 Other seborrheic keratosis: Secondary | ICD-10-CM | POA: Diagnosis not present

## 2018-09-17 DIAGNOSIS — L814 Other melanin hyperpigmentation: Secondary | ICD-10-CM | POA: Diagnosis not present

## 2018-09-17 DIAGNOSIS — L565 Disseminated superficial actinic porokeratosis (DSAP): Secondary | ICD-10-CM | POA: Diagnosis not present

## 2018-09-23 NOTE — Telephone Encounter (Signed)
Pt will run out of SYNTHROID 88 MCG tablet in 8 days.  Pt needing a call back to let her know when this can be called in.  Thanks, [employer]

## 2018-09-23 NOTE — Telephone Encounter (Signed)
Message left for patient

## 2018-09-23 NOTE — Telephone Encounter (Signed)
Advised patient and scheduled an appt with Adriana to follow up for TSH. Appt scheduled for 09/24/2018 @10 :20 am.

## 2018-09-24 ENCOUNTER — Encounter: Payer: Self-pay | Admitting: Physician Assistant

## 2018-09-24 ENCOUNTER — Ambulatory Visit (INDEPENDENT_AMBULATORY_CARE_PROVIDER_SITE_OTHER): Payer: Medicare PPO | Admitting: Physician Assistant

## 2018-09-24 VITALS — BP 140/100 | HR 78 | Temp 97.5°F | Resp 16 | Wt 132.8 lb

## 2018-09-24 DIAGNOSIS — E039 Hypothyroidism, unspecified: Secondary | ICD-10-CM | POA: Diagnosis not present

## 2018-09-24 DIAGNOSIS — Z9081 Acquired absence of spleen: Secondary | ICD-10-CM | POA: Diagnosis not present

## 2018-09-24 DIAGNOSIS — Z23 Encounter for immunization: Secondary | ICD-10-CM

## 2018-09-24 NOTE — Progress Notes (Signed)
Established Patient Office Visit  Subjective:  Patient ID: Jillian Mullins, female    DOB: [birth year]  Age: 77 y.o. MRN: [id number]  CC:  Chief Complaint  Patient presents with  . Follow-up    HPI  Jillian Mullins presents Follow up for hypothyrpoidism  The patient was last seen for this 2 years ago. Changes made at last visit include check labs.  She reports excellent compliance with treatment. She feels that condition is Unchanged. She is not having side effects.    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 Does not recall being vaccinated for meningitis.  ------------------------------------------------------------------------------------    Past Medical History:  Diagnosis Date  . 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 Anemia   . Arthritis   . Bronchitis 03/01/2010  . Complication of anesthesia    pvc's during first cataract  . Contact dermatitis   . Family history of cardiovascular disease 02/23/2009   (father MI 42 yo)  . 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 Hematuria 02/25/2009  . Hypercholesterolemia   . Hyperlipidemia   . Hypothyroidism 02/11/2009  . Post-splenectomy   . Seborrheic dermatitis     Past Surgical History:  Procedure Laterality Date  . ABDOMINAL HYSTERECTOMY    . CATARACT EXTRACTION W/PHACO Right 01/16/2017   Procedure: CATARACT EXTRACTION PHACO AND INTRAOCULAR LENS PLACEMENT (IOC);  Surgeon: Estill Cotta, MD;  Location: [facility] ORS;  Service: Ophthalmology;  Laterality: Right;  [place] 1:27 AP  26.1 CDE  37.97 fluid casette lot #[id number] H  exp  05/04/2018   . CATARACT EXTRACTION W/PHACO Left 02/27/2017   Procedure: CATARACT EXTRACTION PHACO AND INTRAOCULAR LENS PLACEMENT (IOC);  Surgeon: Estill Cotta, MD;  Location: [facility] ORS;  Service: Ophthalmology;  Laterality: Left;  [place]  1:17.6 AP% 24.9 CDE 36.05 FLUID PACK LOT # [id number]  . Colon Polyp Removal     Colonoscopy: 1970  . FRACTURE SURGERY     Wrist fracture repair  . Hysterectomy and BSO  1993  . MOUTH SURGERY     Removed all wisdom teeth  . SPLENECTOMY  1960    Family History  Problem Relation Age of Onset  . Heart failure Mother   . Heart attack Father   . COPD Sister        in her 1's    Social History   Socioeconomic History  . Marital status: Married    Spouse name: Carina Chaplin, Sr.  . Number of children: 3  . Years of education: 77  . Highest education level: Not on file  Occupational History  . Occupation: RETIRED    Comment: [employer]  Social Needs  . Financial resource strain: Not on file  . Food insecurity:    Worry: Not on file    Inability: Not on file  . Transportation needs:    Medical: Not on file    Non-medical: Not on file  Tobacco Use  . Smoking status: Former Smoker    Packs/day: 0.25    Years: 20.00    Pack years: 5.00    Last attempt to quit: 11/05/1995    Years since quitting: 22.9  . Smokeless tobacco: Never Used  Substance and Sexual Activity  . Alcohol use: Yes    Alcohol/week: 8.0 standard drinks    Types: 8 Glasses of wine per week  Comment: Moderate alcohol use; Has cut back and is not a concern  . Drug use: No  . Sexual activity: Not on file  Lifestyle  . Physical activity:    Days per week: Not on file    Minutes per session: Not on file  . Stress: Not on file  Relationships  . Social connections:    Talks on phone: Not on file    Gets together: Not on file    Attends religious service: Not on file    Active member of club or organization: Not on file    Attends meetings of clubs or organizations: Not on file    Relationship status: Not on file  . Intimate partner violence:    Fear of current or ex partner: Not on file    Emotionally abused: Not on file    Physically abused: Not on file    Forced sexual activity: Not on file  Other  Topics Concern  . Not on file  Social History Narrative  . Not on file    Outpatient Medications Prior to Visit  Medication Sig Dispense Refill  . aspirin 325 MG tablet Take 325 mg by mouth every Monday, Wednesday, and Friday.     Marland Kitchen azithromycin (ZITHROMAX) 250 MG tablet As directed 6 tablet 0  . SYNTHROID 88 MCG tablet TAKE 1 TABLET BY MOUTH DAILY 30 tablet 0   No facility-administered medications prior to visit.     Allergies  Allergen Reactions  . Tetracycline Other (See Comments)    Thrombocytopenia purpura  . Other Rash    Many topical products cause terrible rash.    ROS Review of Systems  Constitutional: Negative.   HENT: Negative.   Cardiovascular: Negative.   Endocrine: Negative.       Objective:    Physical Exam  Constitutional: She is oriented to person, place, and time. She appears well-developed and well-nourished.  Neck: No thyromegaly present.  Cardiovascular: Normal rate and regular rhythm.  Pulmonary/Chest: Effort normal and breath sounds normal.  Neurological: She is alert and oriented to person, place, and time.  Skin: Skin is warm and dry.    BP (!) 140/100 (BP Location: Right Arm, Patient Position: Sitting, Cuff Size: Normal)   Pulse 78   Temp (!) 97.5 F (36.4 C) (Oral)   Resp 16   Wt 132 lb 12.8 oz (60.2 kg)   SpO2 98%   BMI 24.29 kg/m  Wt Readings from Last 3 Encounters:  09/24/18 132 lb 12.8 oz (60.2 kg)  02/12/17 125 lb (56.7 kg)  02/11/17 132 lb (59.9 kg)     Health Maintenance Due  Topic Date Due  . COLONOSCOPY  10/03/1992  . COLON CANCER SCREENING ANNUAL FOBT  10/10/2017    There are no preventive care reminders to display for this patient.  Lab Results  Component Value Date   TSH 3.360 09/24/2018   Lab Results  Component Value Date   WBC 6.1 10/10/2016   HGB 16.5 (H) 10/10/2016   HCT 47.1 (H) 10/10/2016   MCV 96 10/10/2016   PLT 316 10/10/2016   Lab Results  Component Value Date   NA 139 10/10/2016   K 4.5  10/10/2016   CO2 23 10/10/2016   GLUCOSE 88 10/10/2016   BUN 17 10/10/2016   CREATININE 1.05 (H) 10/10/2016   BILITOT 0.5 10/10/2016   ALKPHOS 103 10/10/2016   AST 25 10/10/2016   ALT 19 10/10/2016   PROT 7.1 10/10/2016   ALBUMIN 4.6 10/10/2016  CALCIUM 9.8 10/10/2016   Lab Results  Component Value Date   CHOL 347 (H) 10/10/2016   Lab Results  Component Value Date   HDL 85 10/10/2016   Lab Results  Component Value Date   LDLCALC 240 (H) 10/10/2016   Lab Results  Component Value Date   TRIG 110 10/10/2016   Lab Results  Component Value Date   CHOLHDL 4.0 08/04/2015   No results found for: HGBA1C    Assessment & Plan:  1. Adult hypothyroidism  TSH stable, refill 88 mcg QD x 90 days.  - TSH - SYNTHROID 88 MCG tablet; Take 1 tablet (88 mcg total) by mouth daily.  Dispense: 90 tablet; Refill: 0  2. Need for influenza vaccination  - Flu vaccine HIGH DOSE PF  3. Need for pneumococcal vaccination  Administered today, we do not carry Hib, may get this at health department. Counseled about meningitis vaccinations she wishes to discuss this with Dr. Rosanna Randy at her CPE next year.  - Pneumococcal polysaccharide vaccine 23-valent greater than or equal to 2yo subcutaneous/IM  4. H/O splenectomy  See #3.   The entirety of the information documented in the History of Present Illness, Review of Systems and Physical Exam were personally obtained by me. Portions of this information were initially documented by Lynford Humphrey, CMA and reviewed by me for thoroughness and accuracy.   Follow-up: Return in about 2 months (around 11/24/2018).    Trinna Post, PA-C

## 2018-09-25 LAB — TSH: TSH: 3.36 u[IU]/mL (ref 0.450–4.500)

## 2018-09-26 ENCOUNTER — Telehealth: Payer: Self-pay | Admitting: *Deleted

## 2018-09-26 MED ORDER — SYNTHROID 88 MCG PO TABS: 88.0000 ug | ORAL_TABLET | Freq: Every day | ORAL | 0 refills | Status: DC

## 2018-09-26 NOTE — Telephone Encounter (Signed)
Patient advised as below. Patient verbalizes understanding and is in agreement with treatment plan.  

## 2018-09-26 NOTE — Telephone Encounter (Signed)
Pt returned missed call.  Please call pt back. ° °Thanks, °[facility] °

## 2018-09-26 NOTE — Telephone Encounter (Signed)
-----   Message from Trinna Post, [place] sent at 09/26/2018  8:53 AM EST ----- TSH is normal. Refilled synthroid 88 mcg for 90 days. Follow up with Dr. Rosanna Randy for her physical and to talk about meningitis vaccinations.

## 2018-09-26 NOTE — Telephone Encounter (Signed)
LMOVM for pt to return call 

## 2018-10-20 ENCOUNTER — Ambulatory Visit (INDEPENDENT_AMBULATORY_CARE_PROVIDER_SITE_OTHER): Payer: Medicare PPO

## 2018-10-20 VITALS — BP 144/88 | HR 71 | Temp 97.7°F | Ht 62.0 in | Wt 134.2 lb

## 2018-10-20 DIAGNOSIS — Z0000 Encounter for general adult medical examination without abnormal findings: Secondary | ICD-10-CM | POA: Diagnosis not present

## 2018-10-20 NOTE — Patient Instructions (Signed)
Jillian Mullins , Thank you for taking time to come for your Medicare Wellness Visit. I appreciate your ongoing commitment to your health goals. Please review the following plan we discussed and let me know if I can assist you in the future.   Screening recommendations/referrals: Colonoscopy: No longer required.  Mammogram: No longer required.  Bone Density: Pt declines referral today.  Recommended yearly ophthalmology/optometry visit for glaucoma screening and checkup Recommended yearly dental visit for hygiene and checkup  Vaccinations: Influenza vaccine: Up to date Pneumococcal vaccine: Completed series Tdap vaccine: Up to date, due 10/2026 Shingles vaccine: Pt declines today.     Advanced directives: Please bring a copy of your POA (Power of Attorney) and/or Living Will to your next appointment.   Conditions/risks identified: Recommend to cut back on sugar intake and monitor amount of sweets consumed.   Next appointment: 12/03/18 @ 2:00 PM with Dr Rosanna Randy. Pt declined scheduling an AWV for 2020   Preventive Care 65 Years and Older, Female Preventive care refers to lifestyle choices and visits with your health care provider that can promote health and wellness. What does preventive care include?  A yearly physical exam. This is also called an annual well check.  Dental exams once or twice a year.  Routine eye exams. Ask your health care provider how often you should have your eyes checked.  Personal lifestyle choices, including:  Daily care of your teeth and gums.  Regular physical activity.  Eating a healthy diet.  Avoiding tobacco and drug use.  Limiting alcohol use.  Practicing safe sex.  Taking low-dose aspirin every day.  Taking vitamin and mineral supplements as recommended by your health care provider. What happens during an annual well check? The services and screenings done by your health care provider during your annual well check will depend on your age,  overall health, lifestyle risk factors, and family history of disease. Counseling  Your health care provider may ask you questions about your:  Alcohol use.  Tobacco use.  Drug use.  Emotional well-being.  Home and relationship well-being.  Sexual activity.  Eating habits.  History of falls.  Memory and ability to understand (cognition).  Work and work Statistician.  Reproductive health. Screening  You may have the following tests or measurements:  Height, weight, and BMI.  Blood pressure.  Lipid and cholesterol levels. These may be checked every 5 years, or more frequently if you are over 1 years old.  Skin check.  Lung cancer screening. You may have this screening every year starting at age 46 if you have a 30-pack-year history of smoking and currently smoke or have quit within the past 15 years.  Fecal occult blood test (FOBT) of the stool. You may have this test every year starting at age 80.  Flexible sigmoidoscopy or colonoscopy. You may have a sigmoidoscopy every 5 years or a colonoscopy every 10 years starting at age 5.  Hepatitis C blood test.  Hepatitis B blood test.  Sexually transmitted disease (STD) testing.  Diabetes screening. This is done by checking your blood sugar (glucose) after you have not eaten for a while (fasting). You may have this done every 1-3 years.  Bone density scan. This is done to screen for osteoporosis. You may have this done starting at age 22.  Mammogram. This may be done every 1-2 years. Talk to your health care provider about how often you should have regular mammograms. Talk with your health care provider about your test results, treatment options,  and if necessary, the need for more tests. Vaccines  Your health care provider may recommend certain vaccines, such as:  Influenza vaccine. This is recommended every year.  Tetanus, diphtheria, and acellular pertussis (Tdap, Td) vaccine. You may need a Td booster every 10  years.  Zoster vaccine. You may need this after age 14.  Pneumococcal 13-valent conjugate (PCV13) vaccine. One dose is recommended after age 1.  Pneumococcal polysaccharide (PPSV23) vaccine. One dose is recommended after age 46. Talk to your health care provider about which screenings and vaccines you need and how often you need them. This information is not intended to replace advice given to you by your health care provider. Make sure you discuss any questions you have with your health care provider. Document Released: 11/18/2015 Document Revised: 07/11/2016 Document Reviewed: 08/23/2015 Elsevier Interactive Patient Education  2017 [place] Prevention in the Home Falls can cause injuries. They can happen to people of all ages. There are many things you can do to make your home safe and to help prevent falls. What can I do on the outside of my home?  Regularly fix the edges of walkways and driveways and fix any cracks.  Remove anything that might make you trip as you walk through a door, such as a raised step or threshold.  Trim any bushes or trees on the path to your home.  Use bright outdoor lighting.  Clear any walking paths of anything that might make someone trip, such as rocks or tools.  Regularly check to see if handrails are loose or broken. Make sure that both sides of any steps have handrails.  Any raised decks and porches should have guardrails on the edges.  Have any leaves, snow, or ice cleared regularly.  Use sand or salt on walking paths during winter.  Clean up any spills in your garage right away. This includes oil or grease spills. What can I do in the bathroom?  Use night lights.  Install grab bars by the toilet and in the tub and shower. Do not use towel bars as grab bars.  Use non-skid mats or decals in the tub or shower.  If you need to sit down in the shower, use a plastic, non-slip stool.  Keep the floor dry. Clean up any water that  spills on the floor as soon as it happens.  Remove soap buildup in the tub or shower regularly.  Attach bath mats securely with double-sided non-slip rug tape.  Do not have throw rugs and other things on the floor that can make you trip. What can I do in the bedroom?  Use night lights.  Make sure that you have a light by your bed that is easy to reach.  Do not use any sheets or blankets that are too big for your bed. They should not hang down onto the floor.  Have a firm chair that has side arms. You can use this for support while you get dressed.  Do not have throw rugs and other things on the floor that can make you trip. What can I do in the kitchen?  Clean up any spills right away.  Avoid walking on wet floors.  Keep items that you use a lot in easy-to-reach places.  If you need to reach something above you, use a strong step stool that has a grab bar.  Keep electrical cords out of the way.  Do not use floor polish or wax that makes floors slippery. If  you must use wax, use non-skid floor wax.  Do not have throw rugs and other things on the floor that can make you trip. What can I do with my stairs?  Do not leave any items on the stairs.  Make sure that there are handrails on both sides of the stairs and use them. Fix handrails that are broken or loose. Make sure that handrails are as long as the stairways.  Check any carpeting to make sure that it is firmly attached to the stairs. Fix any carpet that is loose or worn.  Avoid having throw rugs at the top or bottom of the stairs. If you do have throw rugs, attach them to the floor with carpet tape.  Make sure that you have a light switch at the top of the stairs and the bottom of the stairs. If you do not have them, ask someone to add them for you. What else can I do to help prevent falls?  Wear shoes that:  Do not have high heels.  Have rubber bottoms.  Are comfortable and fit you well.  Are closed at the  toe. Do not wear sandals.  If you use a stepladder:  Make sure that it is fully opened. Do not climb a closed stepladder.  Make sure that both sides of the stepladder are locked into place.  Ask someone to hold it for you, if possible.  Clearly mark and make sure that you can see:  Any grab bars or handrails.  First and last steps.  Where the edge of each step is.  Use tools that help you move around (mobility aids) if they are needed. These include:  Canes.  Walkers.  Scooters.  Crutches.  Turn on the lights when you go into a dark area. Replace any light bulbs as soon as they burn out.  Set up your furniture so you have a clear path. Avoid moving your furniture around.  If any of your floors are uneven, fix them.  If there are any pets around you, be aware of where they are.  Review your medicines with your doctor. Some medicines can make you feel dizzy. This can increase your chance of falling. Ask your doctor what other things that you can do to help prevent falls. This information is not intended to replace advice given to you by your health care provider. Make sure you discuss any questions you have with your health care provider. Document Released: 08/18/2009 Document Revised: 03/29/2016 Document Reviewed: 11/26/2014 Elsevier Interactive Patient Education  2017 [employer].

## 2018-10-20 NOTE — Progress Notes (Signed)
Subjective:   Jillian Mullins is a 77 y.o. female who presents for Medicare Annual (Subsequent) preventive examination.  Review of Systems:  N/A  Cardiac Risk Factors include: advanced age (>47men, >32 women);dyslipidemia;hypertension;sedentary lifestyle     Objective:     Vitals: BP (!) 144/88 (BP Location: Right Arm)   Pulse 71   Temp 97.7 F (36.5 C) (Oral)   Ht 5\' 2"  (1.575 m)   Wt 134 lb 3.2 oz (60.9 kg)   BMI 24.55 kg/m   Body mass index is 24.55 kg/m.  Advanced Directives 10/20/2018 01/16/2017 09/12/2015 08/04/2015  Does Patient Have a Medical Advance Directive? Yes Yes No Yes  Type of Paramedic of [place];Living will [place];Living will Living will;Healthcare Power of Attorney Living will;Healthcare Power of Attorney  Does patient want to make changes to medical advance directive? - No - Patient declined - -  Copy of [place] in Chart? No - copy requested No - copy requested - -    Tobacco Social History   Tobacco Use  Smoking Status Former Smoker  . Packs/day: 0.25  . Years: 20.00  . Pack years: 5.00  . Last attempt to quit: 11/05/1995  . Years since quitting: 22.9  Smokeless Tobacco Never Used     Counseling given: Not Answered   Clinical Intake:     Pain : No/denies pain Pain Score: 0-No pain     Nutritional Status: BMI of 19-24  Normal Nutritional Risks: None Diabetes: No  How often do you need to have someone help you when you read instructions, pamphlets, or other written materials from your doctor or pharmacy?: 1 - Never  Interpreter Needed?: No  Information entered by :: [facility], LPN  Past Medical History:  Diagnosis Date  . 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 Anemia   . Arthritis   . Bronchitis 03/01/2010  . Complication of anesthesia    pvc's during first cataract  . Contact dermatitis   . Family history of cardiovascular  disease 02/23/2009   (father MI 49 yo)  . 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 Hematuria 02/25/2009  . Hypercholesterolemia   . Hyperlipidemia   . Hypothyroidism 02/11/2009  . Post-splenectomy   . Seborrheic dermatitis    Past Surgical History:  Procedure Laterality Date  . ABDOMINAL HYSTERECTOMY    . CATARACT EXTRACTION W/PHACO Right 01/16/2017   Procedure: CATARACT EXTRACTION PHACO AND INTRAOCULAR LENS PLACEMENT (IOC);  Surgeon: Estill Cotta, MD;  Location: [facility] ORS;  Service: Ophthalmology;  Laterality: Right;  [place] 1:27 AP  26.1 CDE  37.97 fluid casette lot #[id number] H  exp  05/04/2018   . CATARACT EXTRACTION W/PHACO Left 02/27/2017   Procedure: CATARACT EXTRACTION PHACO AND INTRAOCULAR LENS PLACEMENT (IOC);  Surgeon: Estill Cotta, MD;  Location: [facility] ORS;  Service: Ophthalmology;  Laterality: Left;  [place] 1:17.6 AP% 24.9 CDE 36.05 FLUID PACK LOT # [id number]  . Colon Polyp Removal     Colonoscopy: 1970  . FRACTURE SURGERY     Wrist fracture repair  . Hysterectomy and BSO  1993  . MOUTH SURGERY     Removed all wisdom teeth  . SPLENECTOMY  1960   Family History  Problem Relation Age of Onset  . Heart failure Mother   . Heart attack Father   . COPD Sister        in her 62's  . Emphysema Sister    Social History  Socioeconomic History  . Marital status: Married    Spouse name: Tenille Morrill, Sr.  . Number of children: 3  . Years of education: 35  . Highest education level: Bachelor's degree (e.g., BA, AB, BS)  Occupational History  . Occupation: RETIRED    Comment: [employer]  Social Needs  . Financial resource strain: Not hard at all  . Food insecurity:    Worry: Never true    Inability: Never true  . Transportation needs:    Medical: No    Non-medical: No  Tobacco Use  . Smoking status: Former Smoker    Packs/day: 0.25    Years: 20.00    Pack years: 5.00    Last attempt to quit: 11/05/1995    Years since quitting: 22.9  . Smokeless tobacco: Never Used    Substance and Sexual Activity  . Alcohol use: Yes    Alcohol/week: 10.0 standard drinks    Types: 10 Glasses of wine per week    Comment: Moderate alcohol use; Has cut back and is not a concern  . Drug use: No  . Sexual activity: Not on file  Lifestyle  . Physical activity:    Days per week: 6 days    Minutes per session: 40 min  . Stress: Only a little  Relationships  . Social connections:    Talks on phone: Patient refused    Gets together: Patient refused    Attends religious service: Patient refused    Active member of club or organization: Patient refused    Attends meetings of clubs or organizations: Patient refused    Relationship status: Patient refused  Other Topics Concern  . Not on file  Social History Narrative  . Not on file    Outpatient Encounter Medications as of 10/20/2018  Medication Sig  . aspirin 325 MG tablet Take 325 mg by mouth as needed.   Marland Kitchen SYNTHROID 88 MCG tablet Take 1 tablet (88 mcg total) by mouth daily.  Marland Kitchen azithromycin (ZITHROMAX) 250 MG tablet As directed (Patient not taking: Reported on 10/20/2018)   No facility-administered encounter medications on file as of 10/20/2018.     Activities of Daily Living In your present state of health, do you have any difficulty performing the following activities: 10/20/2018  Hearing? N  Vision? N  Difficulty concentrating or making decisions? N  Walking or climbing stairs? N  Dressing or bathing? N  Doing errands, shopping? N  Preparing Food and eating ? N  Using the Toilet? N  In the past six months, have you accidently leaked urine? Y  Comment Occasionally, does not wear protection.  Do you have problems with loss of bowel control? N  Managing your Medications? N  Managing your Finances? N  Housekeeping or managing your Housekeeping? N  Some recent data might be hidden    Patient Care Team: Jerrol Banana., MD as PCP - General (Family Medicine) Oneta Rack, MD  (Dermatology) Estill Cotta, MD (Ophthalmology)    Assessment:   This is a routine wellness examination for [place].  Exercise Activities and Dietary recommendations Current Exercise Habits: Home exercise routine, Type of exercise: walking, Time (Minutes): 45, Frequency (Times/Week): 6, Weekly Exercise (Minutes/Week): 270, Intensity: Moderate, Exercise limited by: None identified  Goals    . DIET - REDUCE SUGAR INTAKE     Recommend to cut back on sugar intake and monitor amount of sweets consumed.        Fall Risk Fall Risk  10/20/2018 10/10/2016  08/04/2015  Falls in the past year? 0 No No   FALL RISK PREVENTION PERTAINING TO THE HOME:  Any stairs in or around the home WITH handrails? No  Home free of loose throw rugs in walkways, pet beds, electrical cords, etc? Yes  Adequate lighting in your home to reduce risk of falls? Yes   ASSISTIVE DEVICES UTILIZED TO PREVENT FALLS:  Life alert? Yes Use of a cane, walker or w/c? No  Grab bars in the bathroom? No  Shower chair or bench in shower? No  Elevated toilet seat or a handicapped toilet? No    TIMED UP AND GO:  Was the test performed? No .     Depression Screen PHQ 2/9 Scores 10/20/2018 10/10/2016 08/04/2015  PHQ - 2 Score 0 0 0     Cognitive Function     6CIT Screen 10/20/2018  What Year? 0 points  What month? 0 points  What time? 0 points  Count back from 20 0 points  Months in reverse 0 points  Repeat phrase 0 points  Total Score 0    Immunization History  Administered Date(s) Administered  . Influenza, High Dose Seasonal PF 08/04/2015, 09/24/2018  . Meningococcal B, OMV 09/12/2015, 12/14/2015  . Meningococcal Conjugate 09/12/2015  . Pneumococcal Conjugate-13 01/04/2014  . Pneumococcal Polysaccharide-23 04/02/1995, 09/19/2005, 09/24/2018  . Td 10/10/2016  . Tdap 07/18/2006    Qualifies for Shingles Vaccine? Yes . Due for Shingrix. Education has been provided regarding the importance of this  vaccine. Pt has been advised to call insurance company to determine out of pocket expense. Advised may also receive vaccine at local pharmacy or Health Dept. Verbalized acceptance and understanding.  Tdap: Up to date  Flu Vaccine: Up to date  Pneumococcal Vaccine: Up to date   Screening Tests Health Maintenance  Topic Date Due  . DEXA SCAN  08/10/2017  . TETANUS/TDAP  10/10/2026  . INFLUENZA VACCINE  Completed  . PNA vac Low Risk Adult  Completed    Cancer Screenings:  Colorectal Screening: No longer required.   Mammogram: No longer required.   Bone Density: Completed 08/11/15. Results reflect OSTEOPOROSIS. Repeat every 2 years. Pt declined referral today.   Lung Cancer Screening: (Low Dose CT Chest recommended if Age 55-80 years, 30 pack-year currently smoking OR have quit w/in 15years.) does not qualify.    Additional Screening:  Vision Screening: Recommended annual ophthalmology exams for early detection of glaucoma and other disorders of the eye.  Dental Screening: Recommended annual dental exams for proper oral hygiene  Community Resource Referral:  CRR required this visit?  No       Plan:  I have personally reviewed and addressed the Medicare Annual Wellness questionnaire and have noted the following in the patient's chart:  A. Medical and social history B. Use of alcohol, tobacco or illicit drugs  C. Current medications and supplements D. Functional ability and status E.  Nutritional status F.  Physical activity G. Advance directives H. List of other physicians I.  Hospitalizations, surgeries, and ER visits in previous 12 months J.  [place] such as hearing and vision if needed, cognitive and depression L. Referrals and appointments - none  In addition, I have reviewed and discussed with patient certain preventive protocols, quality metrics, and best practice recommendations. A written personalized care plan for preventive services as well as  general preventive health recommendations were provided to patient.  See attached scanned questionnaire for additional information.   Signed,  Fabio Neighbors, LPN  Nurse Health Advisor   Nurse Recommendations: Pt declined a DEXA referral today.

## 2018-11-04 ENCOUNTER — Encounter: Payer: Medicare PPO | Admitting: Family Medicine

## 2018-11-10 ENCOUNTER — Encounter: Payer: Medicare PPO | Admitting: Family Medicine

## 2018-12-03 ENCOUNTER — Ambulatory Visit (INDEPENDENT_AMBULATORY_CARE_PROVIDER_SITE_OTHER): Payer: Medicare PPO | Admitting: Family Medicine

## 2018-12-03 ENCOUNTER — Encounter: Payer: Self-pay | Admitting: Family Medicine

## 2018-12-03 VITALS — BP 164/94 | HR 74 | Temp 97.9°F | Resp 16 | Ht 62.0 in | Wt 134.0 lb

## 2018-12-03 DIAGNOSIS — I10 Essential (primary) hypertension: Secondary | ICD-10-CM | POA: Diagnosis not present

## 2018-12-03 DIAGNOSIS — Z0000 Encounter for general adult medical examination without abnormal findings: Secondary | ICD-10-CM

## 2018-12-03 DIAGNOSIS — M810 Age-related osteoporosis without current pathological fracture: Secondary | ICD-10-CM

## 2018-12-03 DIAGNOSIS — E039 Hypothyroidism, unspecified: Secondary | ICD-10-CM

## 2018-12-03 DIAGNOSIS — Z90710 Acquired absence of both cervix and uterus: Secondary | ICD-10-CM

## 2018-12-03 DIAGNOSIS — Z9081 Acquired absence of spleen: Secondary | ICD-10-CM | POA: Diagnosis not present

## 2018-12-03 DIAGNOSIS — E7800 Pure hypercholesterolemia, unspecified: Secondary | ICD-10-CM

## 2018-12-03 NOTE — Progress Notes (Signed)
Patient: Jillian Mullins, Female    DOB: 05/25/1941, 78 y.o.   MRN: [id number] Visit Date: 12/03/2018  Today's Provider: Wilhemena Durie, MD   Chief Complaint  Patient presents with  . Annual wellness exam   Subjective:  Patient saw [facility]enzie for AWE on 10/20/2018.    Annual Physical  Jillian Mullins is a 78 y.o. female. She feels well. She reports exercising not regularly, but she does stay active. She reports she is sleeping well. Patient comes in very infrequently, she has hypothyroidism and hyperlipidemia.  She is status post hysterectomy and has a history of splenectomy. She has never had colonoscopy and her last mammogram was 1996.  She does have osteoporosis. She is married for the second time.  Her husband is Zahli Vetsch who fell and fractured his neck in 2018 and has had 8 surgeries since then.  They have been married for 26 years.  She has 3 sons, 4 step grandchildren and 8 grandchildren and 3 great-grandchildren.    Patient has never had a colonoscopy. Mammogram was in the 90's.    Review of Systems  Constitutional: Negative.   HENT: Negative.   Eyes: Negative.   Respiratory: Negative.   Cardiovascular: Negative.   Gastrointestinal: Negative.   Endocrine: Negative.   Genitourinary: Negative.   Musculoskeletal: Negative.   Skin: Negative.   Allergic/Immunologic: Negative.   Neurological: Negative.   Hematological: Negative.   Psychiatric/Behavioral: Negative.     Social History   Socioeconomic History  . Marital status: Married    Spouse name: Jillian Mullins, Sr.  . Number of children: 3  . Years of education: 83  . Highest education level: Bachelor's degree (e.g., BA, AB, BS)  Occupational History  . Occupation: RETIRED    Comment: [employer]  Social Needs  . Financial resource strain: Not hard at all  . Food insecurity:    Worry: Never true    Inability: Never true  . Transportation needs:    Medical: No    Non-medical: No  Tobacco Use  .  Smoking status: Former Smoker    Packs/day: 0.25    Years: 20.00    Pack years: 5.00    Last attempt to quit: 11/05/1995    Years since quitting: 23.0  . Smokeless tobacco: Never Used  Substance and Sexual Activity  . Alcohol use: Yes    Alcohol/week: 10.0 standard drinks    Types: 10 Glasses of wine per week    Comment: Moderate alcohol use; Has cut back and is not a concern  . Drug use: No  . Sexual activity: Not on file  Lifestyle  . Physical activity:    Days per week: 6 days    Minutes per session: 40 min  . Stress: Only a little  Relationships  . Social connections:    Talks on phone: Patient refused    Gets together: Patient refused    Attends religious service: Patient refused    Active member of club or organization: Patient refused    Attends meetings of clubs or organizations: Patient refused    Relationship status: Patient refused  . Intimate partner violence:    Fear of current or ex partner: Patient refused    Emotionally abused: Patient refused    Physically abused: Patient refused    Forced sexual activity: Patient refused  Other Topics Concern  . Not on file  Social History Narrative  . Not on file    Past Medical History:  Diagnosis Date  . 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 Anemia   . Arthritis   . Bronchitis 03/01/2010  . Complication of anesthesia    pvc's during first cataract  . Contact dermatitis   . Family history of cardiovascular disease 02/23/2009   (father MI 32 yo)  . 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 Hematuria 02/25/2009  . Hypercholesterolemia   . Hyperlipidemia   . Hypothyroidism 02/11/2009  . Post-splenectomy   . Seborrheic dermatitis      Patient Active Problem List   Diagnosis Date Noted  . Absolute anemia 08/04/2015  . BP (high blood pressure) 08/04/2015  . Hypercholesteremia 08/04/2015  . Dermatitis seborrheica 08/04/2015  . Bronchitis 03/01/2010  . CD (contact dermatitis) 02/25/2009    . Blood in the urine 02/25/2009  . HLD (hyperlipidemia) 02/11/2009  . Adult hypothyroidism 02/11/2009  . 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 H/O total hysterectomy 11/05/1990    Past Surgical History:  Procedure Laterality Date  . ABDOMINAL HYSTERECTOMY    . CATARACT EXTRACTION W/PHACO Right 01/16/2017   Procedure: CATARACT EXTRACTION PHACO AND INTRAOCULAR LENS PLACEMENT (IOC);  Surgeon: Estill Cotta, MD;  Location: [facility] ORS;  Service: Ophthalmology;  Laterality: Right;  [place] 1:27 AP  26.1 CDE  37.97 fluid casette lot #[id number] H  exp  05/04/2018   . CATARACT EXTRACTION W/PHACO Left 02/27/2017   Procedure: CATARACT EXTRACTION PHACO AND INTRAOCULAR LENS PLACEMENT (IOC);  Surgeon: Estill Cotta, MD;  Location: [facility] ORS;  Service: Ophthalmology;  Laterality: Left;  [place] 1:17.6 AP% 24.9 CDE 36.05 FLUID PACK LOT # [id number]  . Colon Polyp Removal     Colonoscopy: 1970  . FRACTURE SURGERY     Wrist fracture repair  . Hysterectomy and BSO  1993  . MOUTH SURGERY     Removed all wisdom teeth  . SPLENECTOMY  1960    Her family history includes COPD in her sister; Emphysema in her sister; Heart attack in her father; Heart failure in her mother.      Current Outpatient Medications:  .  SYNTHROID 88 MCG tablet, Take 1 tablet (88 mcg total) by mouth daily., Disp: 90 tablet, Rfl: 0 .  aspirin 325 MG tablet, Take 325 mg by mouth as needed. , Disp: , Rfl:  .  azithromycin (ZITHROMAX) 250 MG tablet, As directed (Patient not taking: Reported on 10/20/2018), Disp: 6 tablet, Rfl: 0  Patient Care Team: Jerrol Banana., MD as PCP - General (Family Medicine) Oneta Rack, MD (Dermatology) Dingeldein, Remo Lipps, MD (Ophthalmology)     Objective:   Vitals: BP (!) 164/94 (BP Location: Left Arm, Patient Position: Sitting, Cuff Size: Normal)   Pulse 74   Temp 97.9 F (36.6 C)   Resp 16   Ht 5\' 2"  (1.575 m)   Wt 134 lb (60.8 kg)   SpO2 96%   BMI 24.51 kg/m   Physical  Exam Constitutional:      Appearance: Normal appearance. She is well-developed and normal weight.  HENT:     Head: Normocephalic and atraumatic.     Right Ear: Tympanic membrane, ear canal and external ear normal.     Left Ear: Tympanic membrane, ear canal and external ear normal.     Nose: Nose normal.     Mouth/Throat:     Pharynx: Oropharynx is clear.  Eyes:     General: No scleral icterus.    Conjunctiva/sclera: Conjunctivae normal.  Neck:     Vascular: No carotid bruit.  Cardiovascular:     Rate and Rhythm: Normal rate and regular rhythm.     Heart sounds: Normal heart sounds.  Pulmonary:     Effort: Pulmonary effort is normal. No respiratory distress.     Breath sounds: Normal breath sounds.  Chest:     Breasts:        Right: Normal.        Left: Normal.  Abdominal:     Palpations: Abdomen is soft.  Musculoskeletal:     Right lower leg: No edema.     Left lower leg: No edema.  Lymphadenopathy:     Cervical: No cervical adenopathy.  Skin:    General: Skin is warm and dry.  Neurological:     General: No focal deficit present.     Mental Status: She is alert and oriented to person, place, and time. Mental status is at baseline.  Psychiatric:        Mood and Affect: Mood normal.        Behavior: Behavior normal.        Thought Content: Thought content normal.        Judgment: Judgment normal.     Activities of Daily Living In your present state of health, do you have any difficulty performing the following activities: 10/20/2018  Hearing? N  Vision? N  Difficulty concentrating or making decisions? N  Walking or climbing stairs? N  Dressing or bathing? N  Doing errands, shopping? N  Preparing Food and eating ? N  Using the Toilet? N  In the past six months, have you accidently leaked urine? Y  Comment Occasionally, does not wear protection.  Do you have problems with loss of bowel control? N  Managing your Medications? N  Managing your Finances? N   Housekeeping or managing your Housekeeping? N  Some recent data might be hidden    Fall Risk Assessment Fall Risk  10/20/2018 10/10/2016 08/04/2015  Falls in the past year? 0 No No     Depression Screen PHQ 2/9 Scores 10/20/2018 10/10/2016 08/04/2015  PHQ - 2 Score 0 0 0    6CIT Screen 10/20/2018  What Year? 0 points  What month? 0 points  What time? 0 points  Count back from 20 0 points  Months in reverse 0 points  Repeat phrase 0 points  Total Score 0     Assessment & Plan:     Annual Wellness Visit  Reviewed patient's Family Medical History Reviewed and updated list of patient's medical providers Assessment of cognitive impairment was done Assessed patient's functional ability Established a written schedule for health screening [place] Completed and Reviewed  Exercise Activities and Dietary recommendations Goals    . DIET - REDUCE SUGAR INTAKE     Recommend to cut back on sugar intake and monitor amount of sweets consumed.        Immunization History  Administered Date(s) Administered  . Influenza, High Dose Seasonal PF 08/04/2015, 09/24/2018  . Meningococcal B, OMV 09/12/2015, 12/14/2015  . Meningococcal Conjugate 09/12/2015  . Pneumococcal Conjugate-13 01/04/2014  . Pneumococcal Polysaccharide-23 04/02/1995, 09/19/2005, 09/24/2018  . Td 10/10/2016  . Tdap 07/18/2006    Health Maintenance  Topic Date Due  . DEXA SCAN  08/10/2017  . TETANUS/TDAP  10/10/2026  . INFLUENZA VACCINE  Completed  . PNA vac Low Risk Adult  Completed     Discussed health benefits of physical activity, and encouraged her to engage in regular exercise appropriate for her age and  condition.  1. Encounter for annual physical exam And is young 78 year old.  Declined reining test in the past.  Recommended mammography.  She has never had a initial colonoscopy.  Commended.  2. Adult hypothyroidism  - TSH  3. Hypercholesteremia  - Lipid panel - CBC  with Differential/Platelet - Comprehensive metabolic panel  4. H/O splenectomy Vaccines updated/reviewed.  5. H/O total hysterectomy   6. Pure hypercholesterolemia   7. Age-related osteoporosis without current pathological fracture May need treatment.  Repeat BMD in the next year.  Not sure from falls would be 1 of patient's biggest risk. 8.  Elevated blood pressure Recheck blood pressure 136/82. I have done the exam and reviewed the above chart and it is accurate to the best of my knowledge. Development worker, community has been used in this note in any air is in the dictation or transcription are unintentional.  Wilhemena Durie, MD  [place]

## 2018-12-31 DIAGNOSIS — E7800 Pure hypercholesterolemia, unspecified: Secondary | ICD-10-CM | POA: Diagnosis not present

## 2018-12-31 DIAGNOSIS — E039 Hypothyroidism, unspecified: Secondary | ICD-10-CM | POA: Diagnosis not present

## 2019-01-01 LAB — LIPID PANEL
CHOLESTEROL TOTAL: 344 mg/dL — AB (ref 100–199)
Chol/HDL Ratio: 4.7 ratio — ABNORMAL HIGH (ref 0.0–4.4)
HDL: 73 mg/dL (ref >39)
LDL CALC: 247 mg/dL — AB (ref 0–99)
Triglycerides: 118 mg/dL (ref 0–149)
VLDL Cholesterol Cal: 24 mg/dL (ref 5–40)

## 2019-01-01 LAB — CBC WITH DIFFERENTIAL/PLATELET
BASOS: 1 %
Basophils Absolute: 0.1 10*3/uL (ref 0.0–0.2)
EOS (ABSOLUTE): 0.3 10*3/uL (ref 0.0–0.4)
EOS: 5 %
HEMATOCRIT: 45.4 % (ref 34.0–46.6)
HEMOGLOBIN: 15.7 g/dL (ref 11.1–15.9)
IMMATURE GRANS (ABS): 0 10*3/uL (ref 0.0–0.1)
IMMATURE GRANULOCYTES: 0 %
LYMPHS: 43 %
Lymphocytes Absolute: 2.2 10*3/uL (ref 0.7–3.1)
MCH: 32.7 pg (ref 26.6–33.0)
MCHC: 34.6 g/dL (ref 31.5–35.7)
MCV: 95 fL (ref 79–97)
MONOCYTES: 10 %
MONOS ABS: 0.5 10*3/uL (ref 0.1–0.9)
NEUTROS PCT: 41 %
Neutrophils Absolute: 2.1 10*3/uL (ref 1.4–7.0)
Platelets: 301 10*3/uL (ref 150–450)
RBC: 4.8 x10E6/uL (ref 3.77–5.28)
RDW: 12.4 % (ref 11.7–15.4)
WBC: 5.2 10*3/uL (ref 3.4–10.8)

## 2019-01-01 LAB — COMPREHENSIVE METABOLIC PANEL
A/G RATIO: 1.8 (ref 1.2–2.2)
ALT: 17 IU/L (ref 0–32)
AST: 22 IU/L (ref 0–40)
Albumin: 4.5 g/dL (ref 3.7–4.7)
Alkaline Phosphatase: 97 IU/L (ref 39–117)
BUN/Creatinine Ratio: 15 (ref 12–28)
BUN: 18 mg/dL (ref 8–27)
Bilirubin Total: 0.5 mg/dL (ref 0.0–1.2)
CALCIUM: 9.5 mg/dL (ref 8.7–10.3)
CO2: 22 mmol/L (ref 20–29)
CREATININE: 1.18 mg/dL — AB (ref 0.57–1.00)
Chloride: 104 mmol/L (ref 96–106)
GFR, EST AFRICAN AMERICAN: 52 mL/min/{1.73_m2} — AB (ref >59)
GFR, EST NON AFRICAN AMERICAN: 45 mL/min/{1.73_m2} — AB (ref >59)
GLOBULIN, TOTAL: 2.5 g/dL (ref 1.5–4.5)
Glucose: 82 mg/dL (ref 65–99)
Potassium: 4.6 mmol/L (ref 3.5–5.2)
SODIUM: 141 mmol/L (ref 134–144)
TOTAL PROTEIN: 7 g/dL (ref 6.0–8.5)

## 2019-01-01 LAB — TSH: TSH: 11.77 u[IU]/mL — ABNORMAL HIGH (ref 0.450–4.500)

## 2019-01-02 DIAGNOSIS — C4442 Squamous cell carcinoma of skin of scalp and neck: Secondary | ICD-10-CM | POA: Diagnosis not present

## 2019-01-02 DIAGNOSIS — D485 Neoplasm of uncertain behavior of skin: Secondary | ICD-10-CM | POA: Diagnosis not present

## 2019-01-05 ENCOUNTER — Telehealth: Payer: Self-pay

## 2019-01-05 NOTE — Telephone Encounter (Signed)
Dr Darnell Level this is not covered by her ins Can we give her something else?

## 2019-01-05 NOTE — Telephone Encounter (Signed)
Generic rosuvastatin.

## 2019-01-05 NOTE — Telephone Encounter (Signed)
Pt advised.  Please send RX to [facility].   Thanks,   -Mickel Baas

## 2019-01-05 NOTE — Telephone Encounter (Signed)
-----   Message from Jerrol Banana., MD sent at 01/04/2019  2:17 PM EST ----- Cholesterol too high.  We should try Crestor 20 mg daily if patient is agreeable.

## 2019-01-05 NOTE — Telephone Encounter (Signed)
Please send this.  Thank you

## 2019-01-06 MED ORDER — ROSUVASTATIN CALCIUM 20 MG PO TABS: 20.0000 mg | ORAL_TABLET | Freq: Every day | ORAL | 3 refills | Status: DC

## 2019-01-06 NOTE — Telephone Encounter (Signed)
Done

## 2019-01-22 DIAGNOSIS — C44329 Squamous cell carcinoma of skin of other parts of face: Secondary | ICD-10-CM | POA: Diagnosis not present

## 2019-01-22 DIAGNOSIS — C4442 Squamous cell carcinoma of skin of scalp and neck: Secondary | ICD-10-CM | POA: Diagnosis not present

## 2019-02-10 DIAGNOSIS — S1083XA Contusion of other specified part of neck, initial encounter: Secondary | ICD-10-CM | POA: Diagnosis not present

## 2019-02-18 ENCOUNTER — Other Ambulatory Visit: Payer: Self-pay | Admitting: Family Medicine

## 2019-02-18 DIAGNOSIS — E039 Hypothyroidism, unspecified: Secondary | ICD-10-CM

## 2019-02-18 MED ORDER — SYNTHROID 88 MCG PO TABS: 88.0000 ug | ORAL_TABLET | Freq: Every day | ORAL | 1 refills | Status: DC

## 2019-02-18 NOTE — Telephone Encounter (Signed)
Patient admits to not taking synthroid on a daily basis. She reports that she still has about 2 weeks left of medication. Patient wants to continue on 76mcg for now. Is that ok? Please advise.

## 2019-02-18 NOTE — Telephone Encounter (Signed)
Please review. Last TSH was in 12/2018 and it was 11.770. No adjustments were made. Ok to refill?

## 2019-02-18 NOTE — Telephone Encounter (Signed)
Increase to 100 mcg.  Recheck in July for office visit.

## 2019-02-18 NOTE — Telephone Encounter (Signed)
Pt needs refill on  Synthroid 88 MCG  [facility]  Thanks teri

## 2019-04-15 ENCOUNTER — Telehealth: Payer: Self-pay

## 2019-04-15 NOTE — Telephone Encounter (Signed)
Patient that they live at the [facility] and is calling to see what steps she needs to follow since her husband Mr. Stetzer was exposed to his grandson who tested positive 06/04 and they just told them today. They are feeling well and they do not have any symptoms. They have been following the guidelines with wearing the mask and the social distance. Advised patient that they need to self quarantine for 14 days and that if they develop any symptoms like cough,sob,allergies symptoms more than usual, body ache to call and we can do a virtual visit or go to the ED if high fever or sob.

## 2019-05-07 DIAGNOSIS — Z08 Encounter for follow-up examination after completed treatment for malignant neoplasm: Secondary | ICD-10-CM | POA: Diagnosis not present

## 2019-05-07 DIAGNOSIS — Z85828 Personal history of other malignant neoplasm of skin: Secondary | ICD-10-CM | POA: Diagnosis not present

## 2019-05-07 DIAGNOSIS — X32XXXA Exposure to sunlight, initial encounter: Secondary | ICD-10-CM | POA: Diagnosis not present

## 2019-05-07 DIAGNOSIS — C44311 Basal cell carcinoma of skin of nose: Secondary | ICD-10-CM | POA: Diagnosis not present

## 2019-05-07 DIAGNOSIS — L570 Actinic keratosis: Secondary | ICD-10-CM | POA: Diagnosis not present

## 2019-05-07 DIAGNOSIS — D485 Neoplasm of uncertain behavior of skin: Secondary | ICD-10-CM | POA: Diagnosis not present

## 2019-05-07 DIAGNOSIS — C44629 Squamous cell carcinoma of skin of left upper limb, including shoulder: Secondary | ICD-10-CM | POA: Diagnosis not present

## 2019-05-21 DIAGNOSIS — C44629 Squamous cell carcinoma of skin of left upper limb, including shoulder: Secondary | ICD-10-CM | POA: Diagnosis not present

## 2019-07-21 ENCOUNTER — Telehealth: Payer: Self-pay | Admitting: Family Medicine

## 2019-07-21 NOTE — Telephone Encounter (Signed)
Pt has some question on a pneumonia shot and a flu shot.  Pt was told to get a flu shot earlier this year. Also wanting to know if she is supposed to get her pneumonia shot every year or every 5 yrs?  Please advise.  Thanks, [employer]

## 2019-07-23 DIAGNOSIS — C44311 Basal cell carcinoma of skin of nose: Secondary | ICD-10-CM | POA: Diagnosis not present

## 2019-07-24 NOTE — Telephone Encounter (Signed)
LMTCB

## 2019-09-26 ENCOUNTER — Other Ambulatory Visit: Payer: Self-pay | Admitting: Family Medicine

## 2019-09-26 DIAGNOSIS — E039 Hypothyroidism, unspecified: Secondary | ICD-10-CM

## 2019-11-02 ENCOUNTER — Telehealth: Payer: Self-pay | Admitting: Family Medicine

## 2019-11-02 NOTE — Telephone Encounter (Signed)
Called and spoke with patient and informed her about the approval of Dr.Gilbert with her getting the Covid Vaccine once it is available at [facility]. She gave verbal understanding.

## 2019-11-02 NOTE — Telephone Encounter (Signed)
yes

## 2019-11-02 NOTE — Telephone Encounter (Signed)
Pt do not have a spleen and she would like to know if she should get  covid 19 vaccine when it becomes available to [place] at [facility] [place]. Pt does get flu shot every year

## 2019-11-12 DIAGNOSIS — Z08 Encounter for follow-up examination after completed treatment for malignant neoplasm: Secondary | ICD-10-CM | POA: Diagnosis not present

## 2019-11-12 DIAGNOSIS — D0439 Carcinoma in situ of skin of other parts of face: Secondary | ICD-10-CM | POA: Diagnosis not present

## 2019-11-12 DIAGNOSIS — Z85828 Personal history of other malignant neoplasm of skin: Secondary | ICD-10-CM | POA: Diagnosis not present

## 2019-11-12 DIAGNOSIS — L570 Actinic keratosis: Secondary | ICD-10-CM | POA: Diagnosis not present

## 2019-11-12 DIAGNOSIS — X32XXXA Exposure to sunlight, initial encounter: Secondary | ICD-10-CM | POA: Diagnosis not present

## 2019-11-12 DIAGNOSIS — L565 Disseminated superficial actinic porokeratosis (DSAP): Secondary | ICD-10-CM | POA: Diagnosis not present

## 2019-11-12 DIAGNOSIS — L821 Other seborrheic keratosis: Secondary | ICD-10-CM | POA: Diagnosis not present

## 2019-11-12 DIAGNOSIS — D485 Neoplasm of uncertain behavior of skin: Secondary | ICD-10-CM | POA: Diagnosis not present

## 2020-01-29 NOTE — Progress Notes (Signed)
Patient: Jillian Mullins, Female    DOB: 1940-11-23, 79 y.o.   MRN: [id number] Visit Date: 02/02/2020  Today's Provider: Wilhemena Durie, MD   Chief Complaint  Patient presents with  . Annual Exam   Subjective:     Patient had AWV with NHA today at 8:20 am.   Complete Physical Jillian Mullins is a 79 y.o. female. She feels well. She reports exercising yes/walking. She reports she is sleeping well. She has had both Covid shots. He has been married for 27 years.  This is her second marriage.  She has 3 sons by her first marriage.  She has 3 grandchildren. -----------------------------------------------------------   Review of Systems  Constitutional: Negative.   HENT: Positive for rhinorrhea and sneezing.   Eyes: Negative.   Respiratory: Negative.   Cardiovascular: Negative.   Gastrointestinal: Negative.   Endocrine: Negative.   Genitourinary: Negative.   Musculoskeletal: Negative.   Allergic/Immunologic: Negative.   Neurological: Negative.   Hematological: Negative.   Psychiatric/Behavioral: Negative.     Social History   Socioeconomic History  . Marital status: Married    Spouse name: Seng Foppe, Sr.  . Number of children: 3  . Years of education: 36  . Highest education level: Bachelor's degree (e.g., BA, AB, BS)  Occupational History  . Occupation: RETIRED    Comment: [employer]  Tobacco Use  . Smoking status: Former Smoker    Packs/day: 0.25    Years: 20.00    Pack years: 5.00    Quit date: 11/05/1995    Years since quitting: 24.2  . Smokeless tobacco: Never Used  Substance and Sexual Activity  . Alcohol use: Not Currently    Comment: due to lent, but typically has 1-2 glasses of wine a night  . Drug use: No  . Sexual activity: Not on file  Other Topics Concern  . Not on file  Social History Narrative  . Not on file   Social Determinants of Health   Financial Resource Strain: Low Risk   . Difficulty of Paying Living Expenses: Not hard at  all  Food Insecurity: No Food Insecurity  . Worried About Charity fundraiser in the Last Year: Never true  . Ran Out of Food in the Last Year: Never true  Transportation Needs: No Transportation Needs  . Lack of Transportation (Medical): No  . Lack of Transportation (Non-Medical): No  Physical Activity: Sufficiently Active  . Days of Exercise per Week: 7 days  . Minutes of Exercise per Session: 30 min  Stress: No Stress Concern Present  . Feeling of Stress : Only a little  Social Connections: Not Isolated  . Frequency of Communication with Friends and Family: More than three times a week  . Frequency of Social Gatherings with Friends and Family: More than three times a week  . Attends Religious Services: More than 4 times per year  . Active Member of Clubs or Organizations: Yes  . Attends Archivist Meetings: More than 4 times per year  . Marital Status: Married  Human resources officer Violence: Not At Risk  . Fear of Current or Ex-Partner: No  . Emotionally Abused: No  . Physically Abused: No  . Sexually Abused: No    Past Medical History:  Diagnosis Date  . 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 Anemia   . Arthritis   . Bronchitis 03/01/2010  . Cancer ([place])    SCC  of nose - removed  . Complication of anesthesia    pvc's during first cataract  . Contact dermatitis   . Family history of cardiovascular disease 02/23/2009   (father MI 19 yo)  . 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 Hematuria 02/25/2009  . Hypercholesterolemia   . Hyperlipidemia   . Hypothyroidism 02/11/2009  . Post-splenectomy   . Seborrheic dermatitis      Patient Active Problem List   Diagnosis Date Noted  . Absolute anemia 08/04/2015  . BP (high blood pressure) 08/04/2015  . Hypercholesteremia 08/04/2015  . Dermatitis seborrheica 08/04/2015  . Bronchitis 03/01/2010  . CD (contact dermatitis) 02/25/2009  . Blood in the urine 02/25/2009  . HLD (hyperlipidemia)  02/11/2009  . Adult hypothyroidism 02/11/2009  . 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 H/O total hysterectomy 11/05/1990    Past Surgical History:  Procedure Laterality Date  . ABDOMINAL HYSTERECTOMY    . CATARACT EXTRACTION W/PHACO Right 01/16/2017   Procedure: CATARACT EXTRACTION PHACO AND INTRAOCULAR LENS PLACEMENT (IOC);  Surgeon: Estill Cotta, MD;  Location: [facility] ORS;  Service: Ophthalmology;  Laterality: Right;  [place] 1:27 AP  26.1 CDE  37.97 fluid casette lot [id number]  exp  05/04/2018   . CATARACT EXTRACTION W/PHACO Left 02/27/2017   Procedure: CATARACT EXTRACTION PHACO AND INTRAOCULAR LENS PLACEMENT (IOC);  Surgeon: Estill Cotta, MD;  Location: [facility] ORS;  Service: Ophthalmology;  Laterality: Left;  [place] 1:17.6 AP% 24.9 CDE 36.05 FLUID PACK LOT # [id number]  . Colon Polyp Removal     Colonoscopy: 1970  . FRACTURE SURGERY     Wrist fracture repair  . Hysterectomy and BSO  1993  . MOHS SURGERY     of nose  . MOUTH SURGERY     Removed all wisdom teeth  . SPLENECTOMY  1960    Her family history includes COPD in her sister; Emphysema in her sister; Heart attack in her father; Heart failure in her mother.   Current Outpatient Medications:  .  aspirin 325 MG tablet, Take 325 mg by mouth as needed. , Disp: , Rfl:  .  SYNTHROID 88 MCG tablet, TAKE 1 TABLET BY MOUTH DAILY, Disp: 90 tablet, Rfl: 0 .  azithromycin (ZITHROMAX) 250 MG tablet, As directed (Patient not taking: Reported on 02/02/2020), Disp: 6 tablet, Rfl: 0 .  rosuvastatin (CRESTOR) 20 MG tablet, Take 1 tablet (20 mg total) by mouth daily. (Patient not taking: Reported on 02/02/2020), Disp: 90 tablet, Rfl: 3  Patient Care Team: Jerrol Banana., MD as PCP - General (Family Medicine) Oneta Rack, MD (Dermatology) Dingeldein, Remo Lipps, MD (Ophthalmology)     Objective:    Vitals: BP (!) 150/8 (BP Location: Right Arm, Patient Position: Sitting, Cuff Size: Normal)   Pulse 72   Temp (!) 96.9 F (36.1 C) (Other  (Comment))   Resp 18   Ht 5\' 2"  (1.575 m)   Wt 130 lb (59 kg)   SpO2 98%   BMI 23.78 kg/m   Physical Exam Vitals reviewed.  Constitutional:      Appearance: Normal appearance. She is well-developed and normal weight.  HENT:     Head: Normocephalic and atraumatic.     Right Ear: Tympanic membrane, ear canal and external ear normal.     Left Ear: Tympanic membrane, ear canal and external ear normal.     Nose: Nose normal.     Mouth/Throat:     Pharynx: Oropharynx is clear.  Eyes:     General: No scleral icterus.  Conjunctiva/sclera: Conjunctivae normal.  Neck:     Vascular: No carotid bruit.  Cardiovascular:     Rate and Rhythm: Normal rate and regular rhythm.     Heart sounds: Normal heart sounds.  Pulmonary:     Effort: Pulmonary effort is normal. No respiratory distress.     Breath sounds: Normal breath sounds.  Chest:     Breasts:        Right: Normal.        Left: Normal.  Abdominal:     Palpations: Abdomen is soft.  Genitourinary:    General: Normal vulva.     Comments: External genitalia is normal.  Mildly atrophic.  Mild cystocele.  Perianal exam is normal.  Bimanual exam and DRE not performed Musculoskeletal:     Right lower leg: No edema.     Left lower leg: No edema.  Lymphadenopathy:     Cervical: No cervical adenopathy.  Skin:    General: Skin is warm and dry.  Neurological:     General: No focal deficit present.     Mental Status: She is alert and oriented to person, place, and time. Mental status is at baseline.  Psychiatric:        Mood and Affect: Mood normal.        Behavior: Behavior normal.        Thought Content: Thought content normal.        Judgment: Judgment normal.     Activities of Daily Living In your present state of health, do you have any difficulty performing the following activities: 02/02/2020  Hearing? N  Vision? N  Difficulty concentrating or making decisions? N  Walking or climbing stairs? N  Dressing or bathing? N   Doing errands, shopping? N  Preparing Food and eating ? N  Using the Toilet? N  In the past six months, have you accidently leaked urine? Y  Comment Occasionally with pressure.  Do you have problems with loss of bowel control? N  Managing your Medications? N  Managing your Finances? N  Housekeeping or managing your Housekeeping? N  Some recent data might be hidden    Fall Risk Assessment Fall Risk  02/02/2020 10/20/2018 10/10/2016 08/04/2015  Falls in the past year? 0 0 No No  Number falls in past yr: 0 - - -  Injury with Fall? 0 - - -     Depression Screen PHQ 2/9 Scores 02/02/2020 10/20/2018 10/10/2016 08/04/2015  PHQ - 2 Score 0 0 0 0    6CIT Screen 10/20/2018  What Year? 0 points  What month? 0 points  What time? 0 points  Count back from 20 0 points  Months in reverse 0 points  Repeat phrase 0 points  Total Score 0       Assessment & Plan:    Annual Physical Reviewed patient's Family Medical History Reviewed and updated list of patient's medical providers Assessment of cognitive impairment was done Assessed patient's functional ability Established a written schedule for health screening [place] Completed and Reviewed  Exercise Activities and Dietary recommendations Goals    . DIET - REDUCE SUGAR INTAKE     Recommend to cut back on sugar intake and monitor amount of sweets consumed.        Immunization History  Administered Date(s) Administered  . Fluad Quad(high Dose 65+) 08/06/2019  . Influenza, High Dose Seasonal PF 08/04/2015, 09/24/2018  . Meningococcal B, OMV 09/12/2015, 12/14/2015  . Meningococcal Conjugate 09/12/2015  . Pneumococcal Conjugate-13  01/04/2014  . Pneumococcal Polysaccharide-23 04/02/1995, 09/19/2005, 09/24/2018  . Td 10/10/2016  . Tdap 07/18/2006    Health Maintenance  Topic Date Due  . DEXA SCAN  08/10/2017  . TETANUS/TDAP  10/10/2026  . INFLUENZA VACCINE  Completed  . PNA vac Low Risk Adult  Completed      Discussed health benefits of physical activity, and encouraged her to engage in regular exercise appropriate for her age and condition.   1. Encounter for annual physical exam Patient declined screening test such as mammography or colonoscopy.  He has declined these for years. - TSH - Comprehensive Metabolic Panel (CMET) - CBC w/Diff/Platelet - Lipid panel  2. Hypercholesteremia On Crestor - TSH - Comprehensive Metabolic Panel (CMET) - CBC w/Diff/Platelet - Lipid panel  3. Adult hypothyroidism On Synthroid - TSH - Comprehensive Metabolic Panel (CMET) - CBC w/Diff/Platelet - Lipid panel - SYNTHROID 88 MCG tablet; Take 1 tablet (88 mcg total) by mouth daily.  Dispense: 30 tablet; Refill: 0  4. Essential hypertension Hold off on blood pressure medicine today.  Will reassess on next visit.  Check blood pressures going forward and will treat appropriately. - TSH - Comprehensive Metabolic Panel (CMET) - CBC w/Diff/Platelet - Lipid panel  ------------------------------------------------------------------------------------------------------------    Wilhemena Durie, MD  [facility]

## 2020-02-01 NOTE — Progress Notes (Signed)
Subjective:   Jillian Mullins is a 79 y.o. female who presents for Medicare Annual (Subsequent) preventive examination.    This visit is being conducted through telemedicine due to the COVID-19 pandemic. This patient has given me verbal consent via doximity to conduct this visit, patient states they are participating from their home address. Some vital signs may be absent or patient reported.    Patient identification: identified by name, DOB, and current address  Review of Systems:  N/A  Cardiac Risk Factors include: advanced age (>44men, >16 women);dyslipidemia     Objective:     Vitals: There were no vitals taken for this visit.  There is no height or weight on file to calculate BMI. Unable to obtain vitals due to visit being conducted via telephonically.   Advanced Directives 02/02/2020 10/20/2018 01/16/2017 09/12/2015 08/04/2015  Does Patient Have a Medical Advance Directive? Yes Yes Yes No Yes  Type of Paramedic of [place];Living will [place];Living will [place];Living will Living will;Healthcare Power of Attorney Living will;Healthcare Power of Attorney  Does patient want to make changes to medical advance directive? - - No - Patient declined - -  Copy of [place] in Chart? No - copy requested No - copy requested No - copy requested - -    Tobacco Social History   Tobacco Use  Smoking Status Former Smoker  . Packs/day: 0.25  . Years: 20.00  . Pack years: 5.00  . Quit date: 11/05/1995  . Years since quitting: 24.2  Smokeless Tobacco Never Used     Counseling given: Not Answered   Clinical Intake:  Pre-visit preparation completed: Yes  Pain : No/denies pain Pain Score: 0-No pain     Nutritional Risks: None Diabetes: No  How often do you need to have someone help you when you read instructions, pamphlets, or other written materials from your doctor or pharmacy?: 1 -  Never  Interpreter Needed?: No  Information entered by :: [facility], LPN  Past Medical History:  Diagnosis Date  . 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 Anemia   . Arthritis   . Bronchitis 03/01/2010  . Cancer ([facility])    SCC of nose - removed  . Complication of anesthesia    pvc's during first cataract  . Contact dermatitis   . Family history of cardiovascular disease 02/23/2009   (father MI 62 yo)  . 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 Hematuria 02/25/2009  . Hypercholesterolemia   . Hyperlipidemia   . Hypothyroidism 02/11/2009  . Post-splenectomy   . Seborrheic dermatitis    Past Surgical History:  Procedure Laterality Date  . ABDOMINAL HYSTERECTOMY    . CATARACT EXTRACTION W/PHACO Right 01/16/2017   Procedure: CATARACT EXTRACTION PHACO AND INTRAOCULAR LENS PLACEMENT (IOC);  Surgeon: Estill Cotta, MD;  Location: [facility] ORS;  Service: Ophthalmology;  Laterality: Right;  [place] 1:27 AP  26.1 CDE  37.97 fluid casette lot [id number]  exp  05/04/2018   . CATARACT EXTRACTION W/PHACO Left 02/27/2017   Procedure: CATARACT EXTRACTION PHACO AND INTRAOCULAR LENS PLACEMENT (IOC);  Surgeon: Estill Cotta, MD;  Location: [facility] ORS;  Service: Ophthalmology;  Laterality: Left;  [place] 1:17.6 AP% 24.9 CDE 36.05 FLUID PACK LOT # [id number]  . Colon Polyp Removal     Colonoscopy: 1970  . FRACTURE SURGERY     Wrist fracture repair  . Hysterectomy and BSO  1993  . MOHS SURGERY  of nose  . MOUTH SURGERY     Removed all wisdom teeth  . SPLENECTOMY  1960   Family History  Problem Relation Age of Onset  . Heart failure Mother   . Heart attack Father   . COPD Sister        in her 54's  . Emphysema Sister    Social History   Socioeconomic History  . Marital status: Married    Spouse name: Keamber Krebs, Sr.  . Number of children: 3  . Years of education: 23  . Highest education level: Bachelor's degree (e.g., BA, AB, BS)  Occupational History  .  Occupation: RETIRED    Comment: [employer]  Tobacco Use  . Smoking status: Former Smoker    Packs/day: 0.25    Years: 20.00    Pack years: 5.00    Quit date: 11/05/1995    Years since quitting: 24.2  . Smokeless tobacco: Never Used  Substance and Sexual Activity  . Alcohol use: Not Currently    Comment: due to lent, but typically has 1-2 glasses of wine a night  . Drug use: No  . Sexual activity: Not on file  Other Topics Concern  . Not on file  Social History Narrative  . Not on file   Social Determinants of Health   Financial Resource Strain: Low Risk   . Difficulty of Paying Living Expenses: Not hard at all  Food Insecurity: No Food Insecurity  . Worried About Charity fundraiser in the Last Year: Never true  . Ran Out of Food in the Last Year: Never true  Transportation Needs: No Transportation Needs  . Lack of Transportation (Medical): No  . Lack of Transportation (Non-Medical): No  Physical Activity: Sufficiently Active  . Days of Exercise per Week: 7 days  . Minutes of Exercise per Session: 30 min  Stress: No Stress Concern Present  . Feeling of Stress : Only a little  Social Connections: Not Isolated  . Frequency of Communication with Friends and Family: More than three times a week  . Frequency of Social Gatherings with Friends and Family: More than three times a week  . Attends Religious Services: More than 4 times per year  . Active Member of Clubs or Organizations: Yes  . Attends Archivist Meetings: More than 4 times per year  . Marital Status: Married    Outpatient Encounter Medications as of 02/02/2020  Medication Sig  . aspirin 325 MG tablet Take 325 mg by mouth as needed.   Marland Kitchen SYNTHROID 88 MCG tablet TAKE 1 TABLET BY MOUTH DAILY  . azithromycin (ZITHROMAX) 250 MG tablet As directed (Patient not taking: Reported on 10/20/2018)  . rosuvastatin (CRESTOR) 20 MG tablet Take 1 tablet (20 mg total) by mouth daily. (Patient not taking: Reported on  02/02/2020)   No facility-administered encounter medications on file as of 02/02/2020.    Activities of Daily Living In your present state of health, do you have any difficulty performing the following activities: 02/02/2020  Hearing? N  Vision? N  Difficulty concentrating or making decisions? N  Walking or climbing stairs? N  Dressing or bathing? N  Doing errands, shopping? N  Preparing Food and eating ? N  Using the Toilet? N  In the past six months, have you accidently leaked urine? Y  Comment Occasionally with pressure.  Do you have problems with loss of bowel control? N  Managing your Medications? N  Managing your Finances? N  Housekeeping or managing  your Housekeeping? N  Some recent data might be hidden    Patient Care Team: Jerrol Banana., MD as PCP - General (Family Medicine) Oneta Rack, MD (Dermatology) Estill Cotta, MD (Ophthalmology)    Assessment:   This is a routine wellness examination for [place].  Exercise Activities and Dietary recommendations Current Exercise Habits: Home exercise routine, Type of exercise: walking, Time (Minutes): 30, Frequency (Times/Week): 7, Weekly Exercise (Minutes/Week): 210, Intensity: Moderate, Exercise limited by: None identified  Goals    . DIET - REDUCE SUGAR INTAKE     Recommend to cut back on sugar intake and monitor amount of sweets consumed.        Fall Risk: Fall Risk  02/02/2020 10/20/2018 10/10/2016 08/04/2015  Falls in the past year? 0 0 No No  Number falls in past yr: 0 - - -  Injury with Fall? 0 - - -    FALL RISK PREVENTION PERTAINING TO THE HOME:  Any stairs in or around the home? Yes  If so, are there any without handrails? No   Home free of loose throw rugs in walkways, pet beds, electrical cords, etc? Yes  Adequate lighting in your home to reduce risk of falls? Yes   ASSISTIVE DEVICES UTILIZED TO PREVENT FALLS:  Life alert? Yes  Use of a cane, walker or w/c? No  Grab bars in the  bathroom? Yes  Shower chair or bench in shower? No  Elevated toilet seat or a handicapped toilet? Yes    TIMED UP AND GO:  Was the test performed? No .    Depression Screen PHQ 2/9 Scores 02/02/2020 10/20/2018 10/10/2016 08/04/2015  PHQ - 2 Score 0 0 0 0     Cognitive Function: Declined today.      6CIT Screen 10/20/2018  What Year? 0 points  What month? 0 points  What time? 0 points  Count back from 20 0 points  Months in reverse 0 points  Repeat phrase 0 points  Total Score 0    Immunization History  Administered Date(s) Administered  . Fluad Quad(high Dose 65+) 08/06/2019  . Influenza, High Dose Seasonal PF 08/04/2015, 09/24/2018  . Meningococcal B, OMV 09/12/2015, 12/14/2015  . Meningococcal Conjugate 09/12/2015  . Pneumococcal Conjugate-13 01/04/2014  . Pneumococcal Polysaccharide-23 04/02/1995, 09/19/2005, 09/24/2018  . Td 10/10/2016  . Tdap 07/18/2006    Qualifies for Shingles Vaccine? Yes . Due for Shingrix. Pt has been advised to call insurance company to determine out of pocket expense. Advised may also receive vaccine at local pharmacy or Health Dept. Verbalized acceptance and understanding.  Tdap: Up to date  Flu Vaccine: Up to date  Pneumococcal Vaccine: Completed series  Screening Tests Health Maintenance  Topic Date Due  . DEXA SCAN  08/10/2017  . TETANUS/TDAP  10/10/2026  . INFLUENZA VACCINE  Completed  . PNA vac Low Risk Adult  Completed    Cancer Screenings:  Colorectal Screening: No longer required.   Mammogram: No longer required.   Bone Density: Completed 08/11/11. Results reflect OSTEOPOROSIS. Repeat every 2 years. Ordered today. Pt aware the office will call re: appt.  Lung Cancer Screening: (Low Dose CT Chest recommended if Age 38-80 years, 30 pack-year currently smoking OR have quit w/in 15years.) does not qualify.   Additional Screening:  Vision Screening: Recommended annual ophthalmology exams for early detection of glaucoma  and other disorders of the eye.  Dental Screening: Recommended annual dental exams for proper oral hygiene  Community Resource Referral:  CRR required  this visit?  No       Plan:  I have personally reviewed and addressed the Medicare Annual Wellness questionnaire and have noted the following in the patient's chart:  A. Medical and social history B. Use of alcohol, tobacco or illicit drugs  C. Current medications and supplements D. Functional ability and status E.  Nutritional status F.  Physical activity G. Advance directives H. List of other physicians I.  Hospitalizations, surgeries, and ER visits in previous 12 months J.  [place] such as hearing and vision if needed, cognitive and depression L. Referrals and appointments   In addition, I have reviewed and discussed with patient certain preventive protocols, quality metrics, and best practice recommendations. A written personalized care plan for preventive services as well as general preventive health recommendations were provided to patient. Nurse Health Advisor  Signed,    Kennis Buell [place], [place]  [phone] Nurse Health Advisor   Nurse Notes: None.

## 2020-02-02 ENCOUNTER — Other Ambulatory Visit: Payer: Self-pay

## 2020-02-02 ENCOUNTER — Ambulatory Visit (INDEPENDENT_AMBULATORY_CARE_PROVIDER_SITE_OTHER): Payer: Medicare PPO | Admitting: Family Medicine

## 2020-02-02 ENCOUNTER — Ambulatory Visit (INDEPENDENT_AMBULATORY_CARE_PROVIDER_SITE_OTHER): Payer: Medicare PPO

## 2020-02-02 ENCOUNTER — Encounter: Payer: Self-pay | Admitting: Family Medicine

## 2020-02-02 VITALS — BP 150/88 | HR 72 | Temp 96.9°F | Resp 18 | Ht 62.0 in | Wt 130.0 lb

## 2020-02-02 DIAGNOSIS — E7800 Pure hypercholesterolemia, unspecified: Secondary | ICD-10-CM

## 2020-02-02 DIAGNOSIS — I10 Essential (primary) hypertension: Secondary | ICD-10-CM

## 2020-02-02 DIAGNOSIS — Z0000 Encounter for general adult medical examination without abnormal findings: Secondary | ICD-10-CM | POA: Diagnosis not present

## 2020-02-02 DIAGNOSIS — E2839 Other primary ovarian failure: Secondary | ICD-10-CM

## 2020-02-02 DIAGNOSIS — E039 Hypothyroidism, unspecified: Secondary | ICD-10-CM

## 2020-02-02 MED ORDER — SYNTHROID 88 MCG PO TABS: 88.0000 ug | ORAL_TABLET | Freq: Every day | ORAL | 0 refills | Status: DC

## 2020-02-02 NOTE — Patient Instructions (Signed)
[name] , Thank you for taking time to come for your Medicare Wellness Visit. I appreciate your ongoing commitment to your health goals. Please review the following plan we discussed and let me know if I can assist you in the future.   Screening recommendations/referrals: Colonoscopy: No longer required.  Mammogram: No longer required.  Bone Density: Ordered today. Pt aware the office will call re: appt. Recommended yearly ophthalmology/optometry visit for glaucoma screening and checkup Recommended yearly dental visit for hygiene and checkup  Vaccinations: Influenza vaccine: Up to date Pneumococcal vaccine: Completed series Tdap vaccine: Up to date Shingles vaccine: Pt declines today.     Advanced directives: Please bring a copy of your POA (Power of Attorney) and/or Living Will to your next appointment.   Conditions/risks identified: Continue to monitor sugar consumption and avoid excessive sweets.   Next appointment: 9:40 AM today with Dr Jillian Mullins. Declined scheduling an AWV for 2022 at this time.    Preventive Care 78 Years and Older, Female Preventive care refers to lifestyle choices and visits with your health care provider that can promote health and wellness. What does preventive care include?  A yearly physical exam. This is also called an annual well check.  Dental exams once or twice a year.  Routine eye exams. Ask your health care provider how often you should have your eyes checked.  Personal lifestyle choices, including:  Daily care of your teeth and gums.  Regular physical activity.  Eating a healthy diet.  Avoiding tobacco and drug use.  Limiting alcohol use.  Practicing safe sex.  Taking low-dose aspirin every day.  Taking vitamin and mineral supplements as recommended by your health care provider. What happens during an annual well check? The services and screenings done by your health care provider during your annual well check will depend on your  age, overall health, lifestyle risk factors, and family history of disease. Counseling  Your health care provider may ask you questions about your:  Alcohol use.  Tobacco use.  Drug use.  Emotional well-being.  Home and relationship well-being.  Sexual activity.  Eating habits.  History of falls.  Memory and ability to understand (cognition).  Work and work Statistician.  Reproductive health. Screening  You may have the following tests or measurements:  Height, weight, and BMI.  Blood pressure.  Lipid and cholesterol levels. These may be checked every 5 years, or more frequently if you are over 62 years old.  Skin check.  Lung cancer screening. You may have this screening every year starting at age 50 if you have a 30-pack-year history of smoking and currently smoke or have quit within the past 15 years.  Fecal occult blood test (FOBT) of the stool. You may have this test every year starting at age 78.  Flexible sigmoidoscopy or colonoscopy. You may have a sigmoidoscopy every 5 years or a colonoscopy every 10 years starting at age 78.  Hepatitis C blood test.  Hepatitis B blood test.  Sexually transmitted disease (STD) testing.  Diabetes screening. This is done by checking your blood sugar (glucose) after you have not eaten for a while (fasting). You may have this done every 1-3 years.  Bone density scan. This is done to screen for osteoporosis. You may have this done starting at age 78.  Mammogram. This may be done every 1-2 years. Talk to your health care provider about how often you should have regular mammograms. Talk with your health care provider about your test results, treatment  options, and if necessary, the need for more tests. Vaccines  Your health care provider may recommend certain vaccines, such as:  Influenza vaccine. This is recommended every year.  Tetanus, diphtheria, and acellular pertussis (Tdap, Td) vaccine. You may need a Td booster  every 10 years.  Zoster vaccine. You may need this after age 78.  Pneumococcal 13-valent conjugate (PCV13) vaccine. One dose is recommended after age 12.  Pneumococcal polysaccharide (PPSV23) vaccine. One dose is recommended after age 50. Talk to your health care provider about which screenings and vaccines you need and how often you need them. This information is not intended to replace advice given to you by your health care provider. Make sure you discuss any questions you have with your health care provider. Document Released: 11/18/2015 Document Revised: 07/11/2016 Document Reviewed: 08/23/2015 Elsevier Interactive Patient Education  2017 [place] Prevention in the Home Falls can cause injuries. They can happen to people of all ages. There are many things you can do to make your home safe and to help prevent falls. What can I do on the outside of my home?  Regularly fix the edges of walkways and driveways and fix any cracks.  Remove anything that might make you trip as you walk through a door, such as a raised step or threshold.  Trim any bushes or trees on the path to your home.  Use bright outdoor lighting.  Clear any walking paths of anything that might make someone trip, such as rocks or tools.  Regularly check to see if handrails are loose or broken. Make sure that both sides of any steps have handrails.  Any raised decks and porches should have guardrails on the edges.  Have any leaves, snow, or ice cleared regularly.  Use sand or salt on walking paths during winter.  Clean up any spills in your garage right away. This includes oil or grease spills. What can I do in the bathroom?  Use night lights.  Install grab bars by the toilet and in the tub and shower. Do not use towel bars as grab bars.  Use non-skid mats or decals in the tub or shower.  If you need to sit down in the shower, use a plastic, non-slip stool.  Keep the floor dry. Clean up any  water that spills on the floor as soon as it happens.  Remove soap buildup in the tub or shower regularly.  Attach bath mats securely with double-sided non-slip rug tape.  Do not have throw rugs and other things on the floor that can make you trip. What can I do in the bedroom?  Use night lights.  Make sure that you have a light by your bed that is easy to reach.  Do not use any sheets or blankets that are too big for your bed. They should not hang down onto the floor.  Have a firm chair that has side arms. You can use this for support while you get dressed.  Do not have throw rugs and other things on the floor that can make you trip. What can I do in the kitchen?  Clean up any spills right away.  Avoid walking on wet floors.  Keep items that you use a lot in easy-to-reach places.  If you need to reach something above you, use a strong step stool that has a grab bar.  Keep electrical cords out of the way.  Do not use floor polish or wax that makes floors slippery.  If you must use wax, use non-skid floor wax.  Do not have throw rugs and other things on the floor that can make you trip. What can I do with my stairs?  Do not leave any items on the stairs.  Make sure that there are handrails on both sides of the stairs and use them. Fix handrails that are broken or loose. Make sure that handrails are as long as the stairways.  Check any carpeting to make sure that it is firmly attached to the stairs. Fix any carpet that is loose or worn.  Avoid having throw rugs at the top or bottom of the stairs. If you do have throw rugs, attach them to the floor with carpet tape.  Make sure that you have a light switch at the top of the stairs and the bottom of the stairs. If you do not have them, ask someone to add them for you. What else can I do to help prevent falls?  Wear shoes that:  Do not have high heels.  Have rubber bottoms.  Are comfortable and fit you well.  Are closed  at the toe. Do not wear sandals.  If you use a stepladder:  Make sure that it is fully opened. Do not climb a closed stepladder.  Make sure that both sides of the stepladder are locked into place.  Ask someone to hold it for you, if possible.  Clearly mark and make sure that you can see:  Any grab bars or handrails.  First and last steps.  Where the edge of each step is.  Use tools that help you move around (mobility aids) if they are needed. These include:  Canes.  Walkers.  Scooters.  Crutches.  Turn on the lights when you go into a dark area. Replace any light bulbs as soon as they burn out.  Set up your furniture so you have a clear path. Avoid moving your furniture around.  If any of your floors are uneven, fix them.  If there are any pets around you, be aware of where they are.  Review your medicines with your doctor. Some medicines can make you feel dizzy. This can increase your chance of falling. Ask your doctor what other things that you can do to help prevent falls. This information is not intended to replace advice given to you by your health care provider. Make sure you discuss any questions you have with your health care provider. Document Released: 08/18/2009 Document Revised: 03/29/2016 Document Reviewed: 11/26/2014 Elsevier Interactive Patient Education  2017 [employer].

## 2020-02-03 ENCOUNTER — Telehealth: Payer: Self-pay

## 2020-02-03 DIAGNOSIS — E039 Hypothyroidism, unspecified: Secondary | ICD-10-CM

## 2020-02-03 LAB — CBC WITH DIFFERENTIAL/PLATELET
Basophils Absolute: 0.1 10*3/uL (ref 0.0–0.2)
Basos: 1 %
EOS (ABSOLUTE): 0.2 10*3/uL (ref 0.0–0.4)
Eos: 3 %
Hematocrit: 45.9 % (ref 34.0–46.6)
Hemoglobin: 15.7 g/dL (ref 11.1–15.9)
Immature Grans (Abs): 0 10*3/uL (ref 0.0–0.1)
Immature Granulocytes: 0 %
Lymphocytes Absolute: 1.7 10*3/uL (ref 0.7–3.1)
Lymphs: 30 %
MCH: 32.5 pg (ref 26.6–33.0)
MCHC: 34.2 g/dL (ref 31.5–35.7)
MCV: 95 fL (ref 79–97)
Monocytes Absolute: 0.4 10*3/uL (ref 0.1–0.9)
Monocytes: 7 %
Neutrophils Absolute: 3.4 10*3/uL (ref 1.4–7.0)
Neutrophils: 59 %
Platelets: 318 10*3/uL (ref 150–450)
RBC: 4.83 x10E6/uL (ref 3.77–5.28)
RDW: 11.9 % (ref 11.7–15.4)
WBC: 5.8 10*3/uL (ref 3.4–10.8)

## 2020-02-03 LAB — LIPID PANEL
Chol/HDL Ratio: 4 ratio (ref 0.0–4.4)
Cholesterol, Total: 271 mg/dL — ABNORMAL HIGH (ref 100–199)
HDL: 67 mg/dL (ref >39)
LDL Chol Calc (NIH): 184 mg/dL — ABNORMAL HIGH (ref 0–99)
Triglycerides: 112 mg/dL (ref 0–149)
VLDL Cholesterol Cal: 20 mg/dL (ref 5–40)

## 2020-02-03 LAB — COMPREHENSIVE METABOLIC PANEL
ALT: 15 IU/L (ref 0–32)
AST: 22 IU/L (ref 0–40)
Albumin/Globulin Ratio: 1.8 (ref 1.2–2.2)
Albumin: 4.3 g/dL (ref 3.7–4.7)
Alkaline Phosphatase: 104 IU/L (ref 39–117)
BUN/Creatinine Ratio: 13 (ref 12–28)
BUN: 12 mg/dL (ref 8–27)
Bilirubin Total: 0.5 mg/dL (ref 0.0–1.2)
CO2: 23 mmol/L (ref 20–29)
Calcium: 9.4 mg/dL (ref 8.7–10.3)
Chloride: 103 mmol/L (ref 96–106)
Creatinine, Ser: 0.95 mg/dL (ref 0.57–1.00)
GFR calc Af Amer: 67 mL/min/{1.73_m2} (ref >59)
GFR calc non Af Amer: 58 mL/min/{1.73_m2} — ABNORMAL LOW (ref >59)
Globulin, Total: 2.4 g/dL (ref 1.5–4.5)
Glucose: 87 mg/dL (ref 65–99)
Potassium: 4.2 mmol/L (ref 3.5–5.2)
Sodium: 139 mmol/L (ref 134–144)
Total Protein: 6.7 g/dL (ref 6.0–8.5)

## 2020-02-03 LAB — TSH: TSH: 0.284 u[IU]/mL — ABNORMAL LOW (ref 0.450–4.500)

## 2020-02-03 MED ORDER — LEVOTHYROXINE SODIUM 75 MCG PO TABS: 75.0000 ug | ORAL_TABLET | Freq: Every day | ORAL | 5 refills | Status: DC

## 2020-02-03 NOTE — Telephone Encounter (Signed)
Called to advise patient on lab results, no answer LVMTCB.

## 2020-02-03 NOTE — Telephone Encounter (Signed)
Pt returned vm regarding lab results. Office closed for lunch. Please return call.

## 2020-02-03 NOTE — Addendum Note (Signed)
Addended by: Gerald Stabs on: 02/03/2020 01:49 PM   Modules accepted: Orders

## 2020-02-03 NOTE — Telephone Encounter (Signed)
-----   Message from Jerrol Banana., MD sent at 02/03/2020  8:31 AM EDT ----- Cut Synthroid from 88 to 75 mcg daily.  Labs otherwise stable.  Cholesterol is high but patient is said she does not want to be on any medications for this.

## 2020-02-03 NOTE — Telephone Encounter (Signed)
Patient advised.

## 2020-02-18 DIAGNOSIS — D0439 Carcinoma in situ of skin of other parts of face: Secondary | ICD-10-CM | POA: Diagnosis not present

## 2020-02-24 ENCOUNTER — Other Ambulatory Visit: Payer: BC Managed Care – PPO

## 2020-04-29 ENCOUNTER — Other Ambulatory Visit: Payer: Self-pay | Admitting: Family Medicine

## 2020-04-29 DIAGNOSIS — E2839 Other primary ovarian failure: Secondary | ICD-10-CM

## 2020-05-18 DIAGNOSIS — L565 Disseminated superficial actinic porokeratosis (DSAP): Secondary | ICD-10-CM | POA: Diagnosis not present

## 2020-05-18 DIAGNOSIS — L814 Other melanin hyperpigmentation: Secondary | ICD-10-CM | POA: Diagnosis not present

## 2020-05-18 DIAGNOSIS — Z08 Encounter for follow-up examination after completed treatment for malignant neoplasm: Secondary | ICD-10-CM | POA: Diagnosis not present

## 2020-05-18 DIAGNOSIS — X32XXXA Exposure to sunlight, initial encounter: Secondary | ICD-10-CM | POA: Diagnosis not present

## 2020-05-18 DIAGNOSIS — Z85828 Personal history of other malignant neoplasm of skin: Secondary | ICD-10-CM | POA: Diagnosis not present

## 2020-05-18 DIAGNOSIS — L570 Actinic keratosis: Secondary | ICD-10-CM | POA: Diagnosis not present

## 2020-07-27 ENCOUNTER — Ambulatory Visit: Payer: Self-pay | Admitting: *Deleted

## 2020-07-27 NOTE — Telephone Encounter (Signed)
Patient wanting to know how long to wait after her flu vaccine to get the Covid booster. Information provided to the patient, due to side effects of both vaccines it is recommended to wait 2 weeks between vaccines. Scheduled patient for flu vaccine.    Reason for Disposition . Health Information question, no triage required and triager able to answer question  Answer Assessment - Initial Assessment Questions 1. REASON FOR CALL or QUESTION: "What is your reason for calling today?" or "How can I best help you?" or "What question do you have that I can help answer?"     How long should I wait after the flu vaccine to get the Covid booster.  Protocols used: INFORMATION ONLY CALL - NO TRIAGE-A-AH

## 2020-07-27 NOTE — Telephone Encounter (Signed)
No flu vaccine appointments available until 08/11/20. Patient will check with her pharmacy and call back if an appointment is still needed.

## 2020-08-25 ENCOUNTER — Other Ambulatory Visit: Payer: Self-pay | Admitting: Family Medicine

## 2020-08-25 DIAGNOSIS — E039 Hypothyroidism, unspecified: Secondary | ICD-10-CM

## 2020-08-25 NOTE — Telephone Encounter (Signed)
Requested medication (s) are due for refill today -yes  Requested medication (s) are on the active medication list -yes  Future visit scheduled -yes  Last refill: 07/28/20  Notes to clinic: Fails protocol for recheck after abnormal/change in dosing- sent for review   Requested Prescriptions  Pending Prescriptions Disp Refills   SYNTHROID 75 MCG tablet [Pharmacy Med Name: SYNTHROID 75 MCG TAB] 30 tablet 5    Sig: TAKE 1 TABLET EVERY DAY ON EMPTY [place] AT [place] [address] [place]      Endocrinology:  Hypothyroid Agents Failed - 08/25/2020  4:09 PM      Failed - TSH needs to be rechecked within 3 months after an abnormal result. Refill until TSH is due.      Failed - TSH in normal range and within 360 days    TSH  Date Value Ref Range Status  02/02/2020 0.284 (L) 0.450 - 4.500 uIU/mL Final          Passed - Valid encounter within last 12 months    Recent Outpatient Visits           6 months ago Encounter for annual physical exam   [facility] Jerrol Banana., MD   1 year ago Encounter for annual physical exam   [facility] Jerrol Banana., MD   1 year ago Adult hypothyroidism   [facility] [place], [place]   3 years ago Abnormal EKG   [facility] Jerrol Banana., MD   3 years ago Bite by animal   [facility] Jerrol Banana., MD       Future Appointments             In 5 months Jerrol Banana., MD [facility], [facility]                Requested Prescriptions  Pending Prescriptions Disp Refills   SYNTHROID 75 MCG tablet [Pharmacy Med Name: SYNTHROID 75 MCG TAB] 30 tablet 5    Sig: TAKE 1 TABLET EVERY DAY ON EMPTY [place] AT [place] [address] [place]      Endocrinology:  Hypothyroid Agents Failed - 08/25/2020  4:09 PM      Failed - TSH needs to be rechecked within 3 months after an  abnormal result. Refill until TSH is due.      Failed - TSH in normal range and within 360 days    TSH  Date Value Ref Range Status  02/02/2020 0.284 (L) 0.450 - 4.500 uIU/mL Final          Passed - Valid encounter within last 12 months    Recent Outpatient Visits           6 months ago Encounter for annual physical exam   [facility] Jerrol Banana., MD   1 year ago Encounter for annual physical exam   [facility] Jerrol Banana., MD   1 year ago Adult hypothyroidism   [facility] [place], [place]   3 years ago Abnormal EKG   [facility] Jerrol Banana., MD   3 years ago Bite by animal   [facility] Jerrol Banana., MD       Future Appointments             In 5 months Jerrol Banana., MD  [employer], [place]

## 2020-11-01 ENCOUNTER — Ambulatory Visit (INDEPENDENT_AMBULATORY_CARE_PROVIDER_SITE_OTHER): Payer: Medicare PPO | Admitting: Family Medicine

## 2020-11-01 ENCOUNTER — Other Ambulatory Visit: Payer: Self-pay

## 2020-11-01 DIAGNOSIS — Z20822 Contact with and (suspected) exposure to covid-19: Secondary | ICD-10-CM | POA: Diagnosis not present

## 2020-11-01 NOTE — Progress Notes (Signed)
Virtual Visit via Telephone Note  I connected with Jillian Mullins on 11/01/20 at  2:40 PM EST by telephone and verified that I am speaking with the correct person using two identifiers.  Location: Patient: Home Provider: Office   I discussed the limitations, risks, security and privacy concerns of performing an evaluation and management service by telephone and the availability of in person appointments. I also discussed with the patient that there may be a patient responsible charge related to this service. The patient expressed understanding and agreed to proceed.   History of Present Illness:  Pt was at a dinner dance on 10/21/20 with her husband and her husband developed a cough 10/22/20. Pt's husband tested pos for covid 10/31/20.   Pt tested neg using at home test on 10/30/20. Pt has no symptoms but wants to be tested so she is sure that she does not have covid.   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    Seborrheic dermatitis    Past Surgical History:  Procedure Laterality Date   ABDOMINAL HYSTERECTOMY     CATARACT EXTRACTION W/PHACO Right 01/16/2017   Procedure: CATARACT EXTRACTION PHACO AND INTRAOCULAR LENS PLACEMENT ([place]);  Surgeon: Estill Cotta, MD;  Location: [facility] ORS;  Service: Ophthalmology;  Laterality: Right;  [place] 1:27 AP  26.1 CDE  37.97 fluid casette lot [id number]  exp  05/04/2018    CATARACT EXTRACTION W/PHACO Left 02/27/2017   Procedure: CATARACT EXTRACTION PHACO  AND INTRAOCULAR LENS PLACEMENT ([facility]);  Surgeon: Estill Cotta, MD;  Location: [facility] ORS;  Service: Ophthalmology;  Laterality: Left;  [place] 1:17.6 AP% 24.9 CDE 36.05 FLUID PACK LOT # [id number]   Colon Polyp Removal     Colonoscopy: 1970   FRACTURE SURGERY     Wrist fracture repair   Hysterectomy and BSO  1993   MOHS SURGERY     of nose   MOUTH SURGERY     Removed all wisdom teeth   SPLENECTOMY  1960   Family History  Problem Relation Age of Onset   Heart failure Mother    Heart attack Father    COPD Sister        in her 34's   Emphysema Sister    Social History   Tobacco Use   Smoking status: Former Smoker    Packs/day: 0.25    Years: 20.00    Pack years: 5.00    Quit date: 11/05/1995    Years since quitting: 25.0   Smokeless tobacco: Never Used  Vaping Use   Vaping Use: Never used  Substance Use Topics   Alcohol use: Not Currently    Comment: due to lent, but typically has 1-2 glasses of wine a night   Drug use: No   Allergies  Allergen Reactions   Tetracycline Other (See Comments)    Thrombocytopenia purpura   Other Rash    Many topical products cause terrible rash.   Current Outpatient Medications on File Prior to  Visit  Medication Sig Dispense Refill   aspirin 325 MG tablet Take 325 mg by mouth as needed.      azithromycin (ZITHROMAX) 250 MG tablet As directed (Patient not taking: Reported on 02/02/2020) 6 tablet 0   rosuvastatin (CRESTOR) 20 MG tablet Take 1 tablet (20 mg total) by mouth daily. (Patient not taking: Reported on 02/02/2020) 90 tablet 3   SYNTHROID 75 MCG tablet TAKE 1 TABLET EVERY DAY ON EMPTY STOMACHWITH A GLASS OF WATER AT LEAST 30-60 MINBEFORE BREAKFAST 30 tablet 5   No current facility-administered medications on file prior to visit.   Review of Systems  Constitutional: Negative.   HENT: Negative.   Respiratory: Negative.   Cardiovascular: Negative.   Skin: Negative.   All other systems reviewed and are  negative.  Assessment and Plan: 1. Exposure to COVID-19 virus Went to a dinner dance on 10-21-20 and husband developed a cough on 10-22-20 (was positive for COVID on 10-31-20 after several tests). This patient has been asymptomatic and had a negative test on 10-30-20. States several people from the dance have tested positive. Advised to maintain COVID restrictions and quarantine. May need to retest if any symptoms develop.   Follow Up Instructions:    I discussed the assessment and treatment plan with the patient. The patient was provided an opportunity to ask questions and all were answered. The patient agreed with the plan and demonstrated an understanding of the instructions.   The patient was advised to call back or seek an in-person evaluation if the symptoms worsen or if the condition fails to improve as anticipated.  I provided 10 minutes of non-face-to-face time during this encounter. I, Keaja Reaume, PA-C, have reviewed all documentation for this visit. The documentation on 11/01/20 for the exam, diagnosis, procedures, and orders are all accurate and complete.   Paticia Stack, CMA

## 2020-12-02 DIAGNOSIS — D0439 Carcinoma in situ of skin of other parts of face: Secondary | ICD-10-CM | POA: Diagnosis not present

## 2020-12-02 DIAGNOSIS — L821 Other seborrheic keratosis: Secondary | ICD-10-CM | POA: Diagnosis not present

## 2020-12-02 DIAGNOSIS — Z08 Encounter for follow-up examination after completed treatment for malignant neoplasm: Secondary | ICD-10-CM | POA: Diagnosis not present

## 2020-12-02 DIAGNOSIS — L570 Actinic keratosis: Secondary | ICD-10-CM | POA: Diagnosis not present

## 2020-12-02 DIAGNOSIS — D485 Neoplasm of uncertain behavior of skin: Secondary | ICD-10-CM | POA: Diagnosis not present

## 2020-12-02 DIAGNOSIS — Z85828 Personal history of other malignant neoplasm of skin: Secondary | ICD-10-CM | POA: Diagnosis not present

## 2020-12-02 DIAGNOSIS — X32XXXA Exposure to sunlight, initial encounter: Secondary | ICD-10-CM | POA: Diagnosis not present

## 2020-12-02 DIAGNOSIS — D0462 Carcinoma in situ of skin of left upper limb, including shoulder: Secondary | ICD-10-CM | POA: Diagnosis not present

## 2020-12-29 DIAGNOSIS — D0462 Carcinoma in situ of skin of left upper limb, including shoulder: Secondary | ICD-10-CM | POA: Diagnosis not present

## 2021-02-07 ENCOUNTER — Encounter: Payer: Self-pay | Admitting: Family Medicine

## 2021-02-27 ENCOUNTER — Other Ambulatory Visit: Payer: Self-pay | Admitting: Family Medicine

## 2021-02-27 DIAGNOSIS — E039 Hypothyroidism, unspecified: Secondary | ICD-10-CM

## 2021-02-27 NOTE — Telephone Encounter (Signed)
   Notes to clinic:  Patient has appt scheduled for 05/04/2021 Review for enough medication until that time   Requested Prescriptions  Pending Prescriptions Disp Refills   SYNTHROID 75 MCG tablet [Pharmacy Med Name: SYNTHROID 75 MCG TAB] 30 tablet 5    Sig: TAKE 1 TABLET EVERY DAY ON EMPTY [place] AT LEAST 30-60 Jillian Mullins      Endocrinology:  Hypothyroid Agents Failed - 02/27/2021 11:22 AM      Failed - TSH needs to be rechecked within 3 months after an abnormal result. Refill until TSH is due.      Failed - TSH in normal range and within 360 days    TSH  Date Value Ref Range Status  02/02/2020 0.284 (L) 0.450 - 4.500 uIU/mL Final          Failed - Valid encounter within last 12 months    Recent Outpatient Visits           3 months ago Exposure to COVID-19 virus   [facility], Vickki Muff, PA-C   1 year ago Encounter for annual physical exam   [facility] Jerrol Banana., MD   2 years ago Encounter for annual physical exam   [facility] Jerrol Banana., MD   2 years ago Adult hypothyroidism   [facility] [place], [place]   4 years ago Abnormal EKG   [facility] Jerrol Banana., MD       Future Appointments             In 2 months Jerrol Banana., MD [facility], [place]

## 2021-03-27 ENCOUNTER — Telehealth: Payer: Self-pay

## 2021-03-27 NOTE — Telephone Encounter (Signed)
Copied from [place] [id number]. Topic: Quick Communication - See Telephone Encounter >> Mar 27, 2021 10:20 AM Blase Mess A wrote: CRM for notification. See Telephone encounter for: 03/27/21. Pt called wanting to cancel her AWV on 05/04/21

## 2021-05-04 ENCOUNTER — Encounter: Payer: Self-pay | Admitting: Family Medicine

## 2021-05-25 ENCOUNTER — Other Ambulatory Visit: Payer: Self-pay | Admitting: Family Medicine

## 2021-05-25 DIAGNOSIS — E039 Hypothyroidism, unspecified: Secondary | ICD-10-CM

## 2021-06-01 DIAGNOSIS — X32XXXA Exposure to sunlight, initial encounter: Secondary | ICD-10-CM | POA: Diagnosis not present

## 2021-06-01 DIAGNOSIS — D225 Melanocytic nevi of trunk: Secondary | ICD-10-CM | POA: Diagnosis not present

## 2021-06-01 DIAGNOSIS — D2261 Melanocytic nevi of right upper limb, including shoulder: Secondary | ICD-10-CM | POA: Diagnosis not present

## 2021-06-01 DIAGNOSIS — Z85828 Personal history of other malignant neoplasm of skin: Secondary | ICD-10-CM | POA: Diagnosis not present

## 2021-06-01 DIAGNOSIS — D2262 Melanocytic nevi of left upper limb, including shoulder: Secondary | ICD-10-CM | POA: Diagnosis not present

## 2021-06-01 DIAGNOSIS — D2271 Melanocytic nevi of right lower limb, including hip: Secondary | ICD-10-CM | POA: Diagnosis not present

## 2021-06-01 DIAGNOSIS — L570 Actinic keratosis: Secondary | ICD-10-CM | POA: Diagnosis not present

## 2021-06-26 ENCOUNTER — Other Ambulatory Visit: Payer: Self-pay | Admitting: Family Medicine

## 2021-06-26 DIAGNOSIS — E039 Hypothyroidism, unspecified: Secondary | ICD-10-CM

## 2021-06-26 NOTE — Telephone Encounter (Signed)
Copied from [place] [id number]. Topic: Quick Communication - Rx Refill/Question >> Jun 26, 2021  2:44 PM Leward Quan A wrote: Medication: SYNTHROID 75 MCG tablet   Has the patient contacted their pharmacy? No. (Agent: If no, request that the patient contact the pharmacy for the refill.) (Agent: If yes, when and what did the pharmacy advise?)  Preferred Pharmacy (with phone number or street name): [place], [place] - [address]  Phone:  [phone] Fax:  [phone]     Agent: Please be advised that RX refills may take up to 3 business days. We ask that you follow-up with your pharmacy.

## 2021-06-26 NOTE — Telephone Encounter (Signed)
Requested medication (s) are due for refill today - yes  Requested medication (s) are on the active medication list -yes  Future visit scheduled -yes  Last refill: 3 months ago  Notes to clinic: Rx request- refused by provider -05/25/21- needs appointment- sent to office for review of request  Requested Prescriptions  Pending Prescriptions Disp Refills   SYNTHROID 75 MCG tablet 30 tablet 1     Endocrinology:  Hypothyroid Agents Failed - 06/26/2021  2:47 PM      Failed - TSH needs to be rechecked within 3 months after an abnormal result. Refill until TSH is due.      Failed - TSH in normal range and within 360 days    TSH  Date Value Ref Range Status  02/02/2020 0.284 (L) 0.450 - 4.500 uIU/mL Final          Failed - Valid encounter within last 12 months    Recent Outpatient Visits           7 months ago Exposure to COVID-19 virus   [facility], Vickki Muff, PA-C   1 year ago Encounter for annual physical exam   [facility] Jerrol Banana., MD   2 years ago Encounter for annual physical exam   [facility] Jerrol Banana., MD   2 years ago Adult hypothyroidism   [facility] [place], [place]   4 years ago Abnormal EKG   [facility] Jerrol Banana., MD       Future Appointments             In 2 months Jerrol Banana., MD [facility], [facility]               Requested Prescriptions  Pending Prescriptions Disp Refills   SYNTHROID 42 MCG tablet 30 tablet 1     Endocrinology:  Hypothyroid Agents Failed - 06/26/2021  2:47 PM      Failed - TSH needs to be rechecked within 3 months after an abnormal result. Refill until TSH is due.      Failed - TSH in normal range and within 360 days    TSH  Date Value Ref Range Status  02/02/2020 0.284 (L) 0.450 - 4.500 uIU/mL Final          Failed - Valid encounter within last 12 months    Recent  Outpatient Visits           7 months ago Exposure to COVID-19 virus   [facility], Vickki Muff, PA-C   1 year ago Encounter for annual physical exam   [facility] Jerrol Banana., MD   2 years ago Encounter for annual physical exam   [facility] Jerrol Banana., MD   2 years ago Adult hypothyroidism   [facility] [place], [place]   4 years ago Abnormal EKG   [facility] Jerrol Banana., MD       Future Appointments             In 2 months Jerrol Banana., MD [facility], [place]

## 2021-06-27 MED ORDER — SYNTHROID 75 MCG PO TABS: ORAL_TABLET | ORAL | 1 refills | Status: DC

## 2021-06-27 NOTE — Telephone Encounter (Signed)
Rx was sent to pharmacy. 

## 2021-08-07 ENCOUNTER — Other Ambulatory Visit: Payer: Self-pay | Admitting: Family Medicine

## 2021-08-07 DIAGNOSIS — E039 Hypothyroidism, unspecified: Secondary | ICD-10-CM

## 2021-08-07 NOTE — Telephone Encounter (Signed)
Requested medication (s) are due for refill today:Yes  Requested medication (s) are on the active medication list: Yes  Last refill:  06/27/21  Future visit scheduled: Yes  Notes to clinic:  Unable to refill per protocol due to failed labs, no updated results.      Requested Prescriptions  Pending Prescriptions Disp Refills   SYNTHROID 75 MCG tablet [Pharmacy Med Name: SYNTHROID 75 MCG TAB] 30 tablet 1    Sig: TAKE ONE TABLET ON AN EMPTY STOMACH WITHA GLASS OF WATER AT LEAST 30 TO 60 MINUTES BEFORE BREAKFAST     Endocrinology:  Hypothyroid Agents Failed - 08/07/2021  9:01 AM      Failed - TSH needs to be rechecked within 3 months after an abnormal result. Refill until TSH is due.      Failed - TSH in normal range and within 360 days    TSH  Date Value Ref Range Status  02/02/2020 0.284 (L) 0.450 - 4.500 uIU/mL Final          Failed - Valid encounter within last 12 months    Recent Outpatient Visits           9 months ago Exposure to COVID-19 virus   [facility], Vickki Muff, PA-C   1 year ago Encounter for annual physical exam   [facility] Jerrol Banana., MD   2 years ago Encounter for annual physical exam   [facility] Jerrol Banana., MD   2 years ago Adult hypothyroidism   [facility] Carles Collet [place], [place]   4 years ago Abnormal EKG   [facility] Jerrol Banana., MD       Future Appointments             In 3 weeks Jerrol Banana., MD [facility], [place]

## 2021-08-30 ENCOUNTER — Other Ambulatory Visit: Payer: Self-pay

## 2021-08-30 ENCOUNTER — Ambulatory Visit (INDEPENDENT_AMBULATORY_CARE_PROVIDER_SITE_OTHER): Payer: Medicare HMO | Admitting: Family Medicine

## 2021-08-30 VITALS — BP 152/95 | HR 99 | Temp 97.5°F | Ht 62.0 in | Wt 121.0 lb

## 2021-08-30 DIAGNOSIS — E7800 Pure hypercholesterolemia, unspecified: Secondary | ICD-10-CM

## 2021-08-30 DIAGNOSIS — Z1231 Encounter for screening mammogram for malignant neoplasm of breast: Secondary | ICD-10-CM

## 2021-08-30 DIAGNOSIS — M810 Age-related osteoporosis without current pathological fracture: Secondary | ICD-10-CM | POA: Diagnosis not present

## 2021-08-30 DIAGNOSIS — I10 Essential (primary) hypertension: Secondary | ICD-10-CM

## 2021-08-30 DIAGNOSIS — Z9081 Acquired absence of spleen: Secondary | ICD-10-CM | POA: Diagnosis not present

## 2021-08-30 DIAGNOSIS — E039 Hypothyroidism, unspecified: Secondary | ICD-10-CM | POA: Diagnosis not present

## 2021-08-30 DIAGNOSIS — Z23 Encounter for immunization: Secondary | ICD-10-CM | POA: Diagnosis not present

## 2021-08-30 DIAGNOSIS — Z0000 Encounter for general adult medical examination without abnormal findings: Secondary | ICD-10-CM

## 2021-08-30 MED ORDER — LOSARTAN POTASSIUM 25 MG PO TABS: 25.0000 mg | ORAL_TABLET | Freq: Every day | ORAL | 3 refills | Status: DC

## 2021-08-30 NOTE — Progress Notes (Signed)
Annual Wellness Visit     Patient: Jillian Mullins, Female    DOB: [birth year], 80 y.o.   MRN: [id number] Visit Date: 08/30/2021  Today's Provider: Wilhemena Durie, MD   No chief complaint on file.  Subjective    Jillian Mullins is a 80 y.o. female who presents today for her Annual Wellness Visit. She reports consuming a  lower sugar and carbs  diet. Exercises daily She generally feels well. She reports sleeping well. She does not have additional problems to discuss today.  Patient is always been very reluctant in the past to do any health maintenance.  She has not had a mammogram in years and has never had a colonoscopy.  She is willing to do some basics at this time.  She walks a lot and is out of the sun a lot.  He sees Dr. Darrick Huntsman from dermatology. She does have a history of osteoporosis and was started on Fosamax but decided not to take it as she was scared of osteonecrosis of the jaw per her dentist.  Overall she is feeling well.  Her husband is 87 and they have 3 sons.  She [place] a lot of time [place]. HPI    Medications: Outpatient Medications Prior to Visit  Medication Sig   aspirin 325 MG tablet Take 325 mg by mouth as needed.    SYNTHROID 75 MCG tablet TAKE ONE TABLET ON AN EMPTY STOMACH WITHA GLASS OF WATER AT LEAST 30 TO 60 MINUTES BEFORE BREAKFAST   rosuvastatin (CRESTOR) 20 MG tablet Take 1 tablet (20 mg total) by mouth daily.   [DISCONTINUED] azithromycin (ZITHROMAX) 250 MG tablet As directed (Patient not taking: Reported on 02/02/2020)   No facility-administered medications prior to visit.    Allergies  Allergen Reactions   Tetracycline Other (See Comments)    Thrombocytopenia purpura   Other Rash    Many topical products cause terrible rash.    Patient Care Team: Jerrol Banana., MD as PCP - General (Family Medicine) Oneta Rack, MD (Dermatology) Estill Cotta, MD (Ophthalmology)  Review of  Systems  Constitutional: Negative.   HENT:  Positive for sneezing.   Eyes: Negative.   Respiratory: Negative.    Cardiovascular: Negative.   Gastrointestinal: Negative.   Endocrine: Negative.   Genitourinary: Negative.   Musculoskeletal: Negative.   Skin: Negative.   Allergic/Immunologic: Negative.   Neurological: Negative.   Hematological: Negative.   Psychiatric/Behavioral: Negative.          Objective    Vitals: BP (!) 152/95 (BP Location: Right Arm, Patient Position: Sitting, Cuff Size: Normal)   Pulse 99   Temp (!) 97.5 F (36.4 C) (Oral)   Ht 5\' 2"  (1.575 m)   Wt 121 lb (54.9 kg)   SpO2 99%   BMI 22.13 kg/m     Physical Exam Constitutional:      Appearance: Normal appearance. She is normal weight.  HENT:     Head: Normocephalic and atraumatic.     Right Ear: Tympanic membrane, ear canal and external ear normal.     Left Ear: Tympanic membrane, ear canal and external ear normal.     Nose: Nose normal.     Mouth/Throat:     Mouth: Mucous membranes are moist.     Pharynx: Oropharynx is clear.  Eyes:     Extraocular Movements: Extraocular movements intact.     Conjunctiva/sclera: Conjunctivae normal.  Pupils: Pupils are equal, round, and reactive to light.  Cardiovascular:     Rate and Rhythm: Normal rate and regular rhythm.     Pulses: Normal pulses.     Heart sounds: Normal heart sounds.  Pulmonary:     Effort: Pulmonary effort is normal.     Breath sounds: Normal breath sounds.  Abdominal:     General: Abdomen is flat. Bowel sounds are normal.     Palpations: Abdomen is soft.  Musculoskeletal:     Cervical back: Normal range of motion and neck supple.     Right lower leg: No edema.     Left lower leg: No edema.  Skin:    General: Skin is warm and dry.     Comments: Tanned on arms and legs and sun exposed areas.  Neurological:     General: No focal deficit present.     Mental Status: She is alert and oriented to person, place, and time. Mental  status is at baseline.  Psychiatric:        Mood and Affect: Mood normal.        Behavior: Behavior normal.        Thought Content: Thought content normal.        Judgment: Judgment normal.    Most recent functional status assessment: In your present state of health, do you have any difficulty performing the following activities: 08/30/2021  Hearing? N  Vision? N  Difficulty concentrating or making decisions? N  Walking or climbing stairs? N  Dressing or bathing? N  Doing errands, shopping? N  Some recent data might be hidden   Most recent fall risk assessment: Fall Risk  08/30/2021  Falls in the past year? 0  Number falls in past yr: 0  Injury with Fall? 0    Most recent depression screenings: PHQ 2/9 Scores 08/30/2021 02/02/2020  PHQ - 2 Score 0 0  PHQ- 9 Score 0 -   Most recent cognitive screening: 6CIT Screen 10/20/2018  What Year? 0 points  What month? 0 points  What time? 0 points  Count back from 20 0 points  Months in reverse 0 points  Repeat phrase 0 points  Total Score 0   Most recent Audit-C alcohol use screening Alcohol Use Disorder Test (AUDIT) 08/30/2021  1. How often do you have a drink containing alcohol? 3  2. How many drinks containing alcohol do you have on a typical day when you are drinking? 0  3. How often do you have six or more drinks on one occasion? 0  AUDIT-C Score 3  Alcohol Brief Interventions/Follow-up -   A score of 3 or more in women, and 4 or more in men indicates increased risk for alcohol abuse, EXCEPT if all of the points are from question 1   No results found for any visits on 08/30/21.  Assessment & Plan     Annual wellness visit done today including the all of the following: Reviewed patient's Family Medical History Reviewed and updated list of patient's medical providers Assessment of cognitive impairment was done Assessed patient's functional ability Established a written schedule for health screening [facility] Completed and Reviewed  Exercise Activities and Dietary recommendations  Goals      DIET - REDUCE SUGAR INTAKE     Recommend to cut back on sugar intake and monitor amount of sweets consumed.         Immunization History  Administered Date(s) Administered   Fluad Quad(high  Dose 65+) 08/06/2019   Influenza, High Dose Seasonal PF 08/04/2015, 09/24/2018, 07/28/2020   Meningococcal B, OMV 09/12/2015, 12/14/2015   Meningococcal Conjugate 09/12/2015   Pneumococcal Conjugate-13 01/04/2014   Pneumococcal Polysaccharide-23 04/02/1995, 09/19/2005, 09/24/2018   Td 10/10/2016   Tdap 07/18/2006    Health Maintenance  Topic Date Due   COVID-19 Vaccine (1) Never done   Hepatitis C Screening  Never done   Zoster Vaccines- Shingrix (1 of 2) Never done   DEXA SCAN  08/10/2017   INFLUENZA VACCINE  06/05/2021   TETANUS/TDAP  10/10/2026   Pneumonia Vaccine 35+ Years old  Completed   HPV VACCINES  Aged Out     Discussed health benefits of physical activity, and encouraged her to engage in regular exercise appropriate for her age and condition.    1. Encounter for annual wellness exam in Medicare patient Recommend COVID-vaccine next month  2. Encounter for annual physical exam She is willing to do mammogram. 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 Dr. Venia Minks had given her meningococcal vaccine in 2017  3. Essential hypertension Start low-dose losartan.  Follow-up formal - CBC with Differential/Platelet - Comprehensive metabolic panel - TSH - losartan (COZAAR) 25 MG tablet; Take 1 tablet (25 mg total) by mouth daily.  Dispense: 90 tablet; Refill: 3  4. Hypercholesteremia  - Lipid Panel With LDL/HDL Ratio  5. Adult hypothyroidism  - TSH  6. Need for influenza vaccination   7. Encounter for screening mammogram for  malignant neoplasm of breast Patient is a very healthy 80 year old. - MM 3D SCREEN BREAST BILATERAL; Future  8. Osteoporosis without current pathological fracture, unspecified osteoporosis type Repeat BMD.  Reconsider Fosamax and consider referral for parenteral therapy if osteoporosis is worse - DG Bone Density; Future   No follow-ups on file.     I, Wilhemena Durie, MD, have reviewed all documentation for this visit. The documentation on 08/30/21 for the exam, diagnosis, procedures, and orders are all accurate and complete.    Soriah Leeman Cranford Mon, MD  [facility] [phone] (phone) [phone] (fax)  [place]

## 2021-08-31 LAB — CBC WITH DIFFERENTIAL/PLATELET
Basophils Absolute: 0.1 10*3/uL (ref 0.0–0.2)
Basos: 2 %
EOS (ABSOLUTE): 0.4 10*3/uL (ref 0.0–0.4)
Eos: 8 %
Hematocrit: 48 % — ABNORMAL HIGH (ref 34.0–46.6)
Hemoglobin: 16.7 g/dL — ABNORMAL HIGH (ref 11.1–15.9)
Immature Grans (Abs): 0 10*3/uL (ref 0.0–0.1)
Immature Granulocytes: 0 %
Lymphocytes Absolute: 1.6 10*3/uL (ref 0.7–3.1)
Lymphs: 33 %
MCH: 32.7 pg (ref 26.6–33.0)
MCHC: 34.8 g/dL (ref 31.5–35.7)
MCV: 94 fL (ref 79–97)
Monocytes Absolute: 0.5 10*3/uL (ref 0.1–0.9)
Monocytes: 10 %
Neutrophils Absolute: 2.2 10*3/uL (ref 1.4–7.0)
Neutrophils: 47 %
Platelets: 293 10*3/uL (ref 150–450)
RBC: 5.1 x10E6/uL (ref 3.77–5.28)
RDW: 11.9 % (ref 11.7–15.4)
WBC: 4.7 10*3/uL (ref 3.4–10.8)

## 2021-08-31 LAB — LIPID PANEL WITH LDL/HDL RATIO
Cholesterol, Total: 263 mg/dL — ABNORMAL HIGH (ref 100–199)
HDL: 68 mg/dL (ref >39)
LDL Chol Calc (NIH): 179 mg/dL — ABNORMAL HIGH (ref 0–99)
LDL/HDL Ratio: 2.6 ratio (ref 0.0–3.2)
Triglycerides: 95 mg/dL (ref 0–149)
VLDL Cholesterol Cal: 16 mg/dL (ref 5–40)

## 2021-08-31 LAB — COMPREHENSIVE METABOLIC PANEL
ALT: 17 IU/L (ref 0–32)
AST: 25 IU/L (ref 0–40)
Albumin/Globulin Ratio: 1.6 (ref 1.2–2.2)
Albumin: 4.4 g/dL (ref 3.7–4.7)
Alkaline Phosphatase: 78 IU/L (ref 44–121)
BUN/Creatinine Ratio: 14 (ref 12–28)
BUN: 13 mg/dL (ref 8–27)
Bilirubin Total: 0.3 mg/dL (ref 0.0–1.2)
CO2: 23 mmol/L (ref 20–29)
Calcium: 9.9 mg/dL (ref 8.7–10.3)
Chloride: 100 mmol/L (ref 96–106)
Creatinine, Ser: 0.95 mg/dL (ref 0.57–1.00)
Globulin, Total: 2.8 g/dL (ref 1.5–4.5)
Glucose: 91 mg/dL (ref 70–99)
Potassium: 4.8 mmol/L (ref 3.5–5.2)
Sodium: 139 mmol/L (ref 134–144)
Total Protein: 7.2 g/dL (ref 6.0–8.5)
eGFR: 61 mL/min/{1.73_m2} (ref >59)

## 2021-08-31 LAB — TSH: TSH: 0.852 u[IU]/mL (ref 0.450–4.500)

## 2021-09-06 ENCOUNTER — Telehealth: Payer: Self-pay | Admitting: *Deleted

## 2021-09-06 NOTE — Telephone Encounter (Signed)
Patient returned call- notified:  Labs stable. Please advise patient.

## 2021-09-12 LAB — HM DEXA SCAN

## 2021-09-15 ENCOUNTER — Other Ambulatory Visit: Payer: Self-pay | Admitting: Family Medicine

## 2021-09-15 DIAGNOSIS — E039 Hypothyroidism, unspecified: Secondary | ICD-10-CM

## 2021-10-04 ENCOUNTER — Other Ambulatory Visit: Payer: BC Managed Care – PPO

## 2021-10-10 ENCOUNTER — Encounter: Payer: Self-pay | Admitting: Family Medicine

## 2021-12-06 DIAGNOSIS — D225 Melanocytic nevi of trunk: Secondary | ICD-10-CM | POA: Diagnosis not present

## 2021-12-06 DIAGNOSIS — L538 Other specified erythematous conditions: Secondary | ICD-10-CM | POA: Diagnosis not present

## 2021-12-06 DIAGNOSIS — L570 Actinic keratosis: Secondary | ICD-10-CM | POA: Diagnosis not present

## 2021-12-06 DIAGNOSIS — B078 Other viral warts: Secondary | ICD-10-CM | POA: Diagnosis not present

## 2021-12-06 DIAGNOSIS — D2262 Melanocytic nevi of left upper limb, including shoulder: Secondary | ICD-10-CM | POA: Diagnosis not present

## 2021-12-06 DIAGNOSIS — L821 Other seborrheic keratosis: Secondary | ICD-10-CM | POA: Diagnosis not present

## 2021-12-06 DIAGNOSIS — D2271 Melanocytic nevi of right lower limb, including hip: Secondary | ICD-10-CM | POA: Diagnosis not present

## 2021-12-06 DIAGNOSIS — Z85828 Personal history of other malignant neoplasm of skin: Secondary | ICD-10-CM | POA: Diagnosis not present

## 2022-01-15 ENCOUNTER — Ambulatory Visit
Admission: RE | Admit: 2022-01-15 | Discharge: 2022-01-15 | Disposition: A | Discharge status: Home / Self Care | Payer: Medicare HMO | Source: Ambulatory Visit | Attending: Family Medicine | Admitting: Family Medicine

## 2022-01-15 ENCOUNTER — Other Ambulatory Visit: Payer: Self-pay | Admitting: Family Medicine

## 2022-01-15 ENCOUNTER — Other Ambulatory Visit: Payer: Self-pay

## 2022-01-15 DIAGNOSIS — Z780 Asymptomatic menopausal state: Secondary | ICD-10-CM | POA: Diagnosis not present

## 2022-01-15 DIAGNOSIS — M810 Age-related osteoporosis without current pathological fracture: Secondary | ICD-10-CM

## 2022-01-15 DIAGNOSIS — N6489 Other specified disorders of breast: Secondary | ICD-10-CM

## 2022-01-15 DIAGNOSIS — Z1231 Encounter for screening mammogram for malignant neoplasm of breast: Secondary | ICD-10-CM | POA: Diagnosis not present

## 2022-01-15 DIAGNOSIS — M85851 Other specified disorders of bone density and structure, right thigh: Secondary | ICD-10-CM | POA: Diagnosis not present

## 2022-01-15 DIAGNOSIS — R928 Other abnormal and inconclusive findings on diagnostic imaging of breast: Secondary | ICD-10-CM

## 2022-02-01 ENCOUNTER — Other Ambulatory Visit: Payer: Self-pay | Admitting: Family Medicine

## 2022-02-01 ENCOUNTER — Ambulatory Visit
Admission: RE | Admit: 2022-02-01 | Discharge: 2022-02-01 | Disposition: A | Discharge status: Home / Self Care | Payer: Medicare HMO | Source: Ambulatory Visit | Attending: Family Medicine | Admitting: Family Medicine

## 2022-02-01 DIAGNOSIS — R928 Other abnormal and inconclusive findings on diagnostic imaging of breast: Secondary | ICD-10-CM | POA: Diagnosis not present

## 2022-02-01 DIAGNOSIS — N6489 Other specified disorders of breast: Secondary | ICD-10-CM

## 2022-02-01 DIAGNOSIS — R922 Inconclusive mammogram: Secondary | ICD-10-CM | POA: Diagnosis not present

## 2022-02-01 DIAGNOSIS — E039 Hypothyroidism, unspecified: Secondary | ICD-10-CM

## 2022-02-02 NOTE — Telephone Encounter (Signed)
Requested Prescriptions  ?Pending Prescriptions Disp Refills  ?? SYNTHROID 75 MCG tablet [Pharmacy Med Name: SYNTHROID 75 MCG TAB] 90 tablet 1  ?  Sig: TAKE ONE TABLET ON AN EMPTY STOMACH WITHA GLASS OF WATER AT LEAST 30 TO 60 MINUTES BEFORE BREAKFAST  ?  ? Endocrinology:  Hypothyroid Agents Passed - 02/01/2022 11:23 AM  ?  ?  Passed - TSH in normal range and within 360 days  ?  TSH  ?Date Value Ref Range Status  ?08/30/2021 0.852 0.450 - 4.500 uIU/mL Final  ?   ?  ?  Passed - Valid encounter within last 12 months  ?  Recent Outpatient Visits   ?      ? 5 months ago Encounter for annual wellness exam in Medicare patient  ? [facility] Jerrol Banana., MD  ? 1 year ago Exposure to COVID-19 virus  ? [place], PA-C  ? 2 years ago Encounter for annual physical exam  ? [facility] Jerrol Banana., MD  ? 3 years ago Encounter for annual physical exam  ? [facility] Jerrol Banana., MD  ? 3 years ago Adult hypothyroidism  ? [facility] [place], [place], [place]  ?  ?  ?Future Appointments   ?        ? In 3 weeks Jerrol Banana., MD [facility], [facility]  ?  ? ?  ?  ?  ? ?

## 2022-02-27 NOTE — Progress Notes (Signed)
? ?I,Elena D DeSanto,acting as a scribe for Wilhemena Durie, MD.,have documented all relevant documentation on the behalf of Wilhemena Durie, MD,as directed by  Wilhemena Durie, MD while in the presence of Wilhemena Durie, MD. ?  ? ? ?Established patient visit ? ? ?Patient: Jillian Mullins   DOB: May 19, 1941   81 y.o. Female  MRN: [id number] ?Visit Date: 02/28/2022 ? ?Today's healthcare provider: Wilhemena Durie, MD  ? ?No chief complaint on file. ? ?Subjective  ?  ?HPI  ?Patient comes in today for follow-up.  She has 3 sons and 3 grandchildren.  She is feeling well.  Status post splenectomy in 1969 and thinks she is up-to-date on all her vaccines for that. ?For her other problems which she has been really consistent to treat she has only been taking 2 weeks of losartan.  She is scared of taking Fosamax for osteoporosis.  We had a discussion today about statins. ?Hypertension, follow-up ? ?BP Readings from Last 3 Encounters:  ?02/28/22 (!) 147/86  ?08/30/21 (!) 152/95  ?02/02/20 (!) 150/88  ? Wt Readings from Last 3 Encounters:  ?02/28/22 121 lb (54.9 kg)  ?08/30/21 121 lb (54.9 kg)  ?02/02/20 130 lb (59 kg)  ?  ? ?She was last seen for hypertension 6 months ago.  ?BP at that visit was as above. Management since that visit includes none. ? ? ?Symptoms: ?No chest pain No chest pressure  ?No palpitations No syncope  ?No dyspnea No orthopnea  ?No paroxysmal nocturnal dyspnea No lower extremity edema  ? ?Pertinent labs ?Lab Results  ?Component Value Date  ? CHOL 263 (H) 08/30/2021  ? HDL 68 08/30/2021  ? LDLCALC 179 (H) 08/30/2021  ? TRIG 95 08/30/2021  ? CHOLHDL 4.0 02/02/2020  ? Lab Results  ?Component Value Date  ? NA 139 08/30/2021  ? K 4.8 08/30/2021  ? CREATININE 0.95 08/30/2021  ? EGFR 61 08/30/2021  ? GLUCOSE 91 08/30/2021  ? TSH 0.852 08/30/2021  ?  ? ?The 10-year ASCVD risk score (Arnett DK, et al., 2019) is:  39.5% ? ?--------------------------------------------------------------------------------------------------- ? ? ?Medications: ?Outpatient Medications Prior to Visit  ?Medication Sig  ? aspirin 325 MG tablet Take 325 mg by mouth as needed.   ? losartan (COZAAR) 25 MG tablet Take 1 tablet (25 mg total) by mouth daily.  ? SYNTHROID 75 MCG tablet TAKE ONE TABLET ON AN EMPTY STOMACH WITHA GLASS OF WATER AT LEAST 30 TO 60 MINUTES BEFORE BREAKFAST  ? [DISCONTINUED] rosuvastatin (CRESTOR) 20 MG tablet Take 1 tablet (20 mg total) by mouth daily.  ? ?No facility-administered medications prior to visit.  ? ? ?Review of Systems ? ?  ?  Objective  ?  ?BP (!) 147/86   Pulse 80   Temp 98.7 ?F (37.1 ?C) (Oral)   Wt 121 lb (54.9 kg)   SpO2 100%   BMI 22.13 kg/m?  ?Vitals:  ? 02/28/22 1106 02/28/22 1111  ?BP: (!) 144/88 (!) 147/86  ?Pulse: 80   ?Temp: 98.7 ?F (37.1 ?C)   ?SpO2: 100%   ? ?The 10-year ASCVD risk score (Arnett DK, et al., 2019) is: 39.5%  ? ?Physical Exam ?Vitals reviewed.  ?Constitutional:   ?   General: She is not in acute distress. ?   Appearance: She is well-developed.  ?HENT:  ?   Head: Normocephalic and atraumatic.  ?   Right Ear: Hearing normal.  ?   Left Ear: Hearing normal.  ?   Nose: Nose normal.  ?  Eyes:  ?   General: Lids are normal. No scleral icterus.    ?   Right eye: No discharge.     ?   Left eye: No discharge.  ?   Conjunctiva/sclera: Conjunctivae normal.  ?Cardiovascular:  ?   Rate and Rhythm: Normal rate and regular rhythm.  ?   Heart sounds: Normal heart sounds.  ?Pulmonary:  ?   Effort: Pulmonary effort is normal. No respiratory distress.  ?Skin: ?   Findings: No lesion or rash.  ?Neurological:  ?   General: No focal deficit present.  ?   Mental Status: She is alert and oriented to person, place, and time.  ?Psychiatric:     ?   Mood and Affect: Mood normal.     ?   Speech: Speech normal.     ?   Behavior: Behavior normal.     ?   Thought Content: Thought content normal.     ?   Judgment:  Judgment normal.  ?  ? ? ?No results found for any visits on 02/28/22. ? Assessment & Plan  ?  ? ?1. Primary hypertension ?Losartan daily and follow-up in a couple of months. ? ?2. Adult hypothyroidism ?Treat for euthyroid TSH ? ?3. H/O splenectomy ?Check on vaccine status.  I think she is up-to-date. ? ?4. Hypercholesteremia ?Add Crestor 10 mg at night. ?5.  Osteoporosis ?Patient is too scared about jaw necrosis to take Fosamax.  Her dentist has talked to her about it. ? ?No follow-ups on file.  ?   ? ?I, Wilhemena Durie, MD, have reviewed all documentation for this visit. The documentation on 03/05/22 for the exam, diagnosis, procedures, and orders are all accurate and complete. ? ? ? ?Oris Calmes Cranford Mon, MD  ?[facility] ?[phone] (phone) ?[phone] (fax) ? ?[facility] ?

## 2022-02-28 ENCOUNTER — Encounter: Payer: Self-pay | Admitting: Family Medicine

## 2022-02-28 ENCOUNTER — Ambulatory Visit (INDEPENDENT_AMBULATORY_CARE_PROVIDER_SITE_OTHER): Payer: Medicare HMO | Admitting: Family Medicine

## 2022-02-28 VITALS — BP 147/86 | HR 80 | Temp 98.7°F | Wt 121.0 lb

## 2022-02-28 DIAGNOSIS — Z9081 Acquired absence of spleen: Secondary | ICD-10-CM | POA: Diagnosis not present

## 2022-02-28 DIAGNOSIS — I10 Essential (primary) hypertension: Secondary | ICD-10-CM

## 2022-02-28 DIAGNOSIS — M810 Age-related osteoporosis without current pathological fracture: Secondary | ICD-10-CM | POA: Diagnosis not present

## 2022-02-28 DIAGNOSIS — E039 Hypothyroidism, unspecified: Secondary | ICD-10-CM

## 2022-02-28 DIAGNOSIS — E7800 Pure hypercholesterolemia, unspecified: Secondary | ICD-10-CM | POA: Diagnosis not present

## 2022-02-28 MED ORDER — ROSUVASTATIN CALCIUM 10 MG PO TABS: 10.0000 mg | ORAL_TABLET | Freq: Every day | ORAL | 3 refills | Status: AC

## 2022-05-29 NOTE — Progress Notes (Unsigned)
Argentina Ponder DeSanto,acting as a scribe for Wilhemena Durie, MD.,have documented all relevant documentation on the behalf of Wilhemena Durie, MD,as directed by  Wilhemena Durie, MD while in the presence of Wilhemena Durie, MD.     Established patient visit   Patient: Jillian Mullins   DOB: February 04, 1941   81 y.o. Female  MRN: [id number] Visit Date: 05/30/2022  Today's healthcare provider: Wilhemena Durie, MD   No chief complaint on file.  Subjective    HPI  Patient feels great.  She has no complaints.  Her blood pressures at home are running in the 1 12-4 60 systolic range.  She is tolerating losartan at the low-dose of 25 mg daily.  Hypertension, follow-up  BP Readings from Last 3 Encounters:  05/30/22 122/85  02/28/22 (!) 147/86  08/30/21 (!) 152/95   Wt Readings from Last 3 Encounters:  05/30/22 123 lb (55.8 kg)  02/28/22 121 lb (54.9 kg)  08/30/21 121 lb (54.9 kg)     She was last seen for hypertension 3 months ago.  BP at that visit was as above. Management since that visit includes none.    Pertinent labs Lab Results  Component Value Date   CHOL 263 (H) 08/30/2021   HDL 68 08/30/2021   LDLCALC 179 (H) 08/30/2021   TRIG 95 08/30/2021   CHOLHDL 4.0 02/02/2020   Lab Results  Component Value Date   NA 139 08/30/2021   K 4.8 08/30/2021   CREATININE 0.95 08/30/2021   EGFR 61 08/30/2021   GLUCOSE 91 08/30/2021   TSH 0.852 08/30/2021     The 10-year ASCVD risk score (Arnett DK, et al., 2019) is: 29.2%  ---------------------------------------------------------------------------------------------------   Medications: Outpatient Medications Prior to Visit  Medication Sig   aspirin 325 MG tablet Take 325 mg by mouth as needed.    losartan (COZAAR) 25 MG tablet Take 1 tablet (25 mg total) by mouth daily.   rosuvastatin (CRESTOR) 10 MG tablet Take 1 tablet (10 mg total) by mouth daily.   SYNTHROID 75 MCG tablet TAKE ONE TABLET ON AN EMPTY STOMACH  WITHA GLASS OF WATER AT LEAST 30 TO 60 MINUTES BEFORE BREAKFAST   No facility-administered medications prior to visit.    Review of Systems  Respiratory:  Negative for cough, shortness of breath and wheezing.   Cardiovascular:  Negative for chest pain, palpitations and leg swelling.    Last metabolic panel Lab Results  Component Value Date   GLUCOSE 91 08/30/2021   NA 139 08/30/2021   K 4.8 08/30/2021   CL 100 08/30/2021   CO2 23 08/30/2021   BUN 13 08/30/2021   CREATININE 0.95 08/30/2021   EGFR 61 08/30/2021   CALCIUM 9.9 08/30/2021   PROT 7.2 08/30/2021   ALBUMIN 4.4 08/30/2021   LABGLOB 2.8 08/30/2021   AGRATIO 1.6 08/30/2021   BILITOT 0.3 08/30/2021   ALKPHOS 78 08/30/2021   AST 25 08/30/2021   ALT 17 08/30/2021       Objective    BP 122/85 (BP Location: Right Arm, Patient Position: Sitting, Cuff Size: Normal)   Pulse 81   Temp (!) 97.5 F (36.4 C) (Oral)   Wt 123 lb (55.8 kg)   SpO2 96%   BMI 22.50 kg/m  BP Readings from Last 3 Encounters:  05/30/22 122/85  02/28/22 (!) 147/86  08/30/21 (!) 152/95   Wt Readings from Last 3 Encounters:  05/30/22 123 lb (55.8 kg)  02/28/22 121 lb (54.9 kg)  08/30/21 121 lb (54.9 kg)      Physical Exam Vitals reviewed.  Constitutional:      General: She is not in acute distress.    Appearance: She is well-developed.  HENT:     Head: Normocephalic and atraumatic.     Right Ear: Hearing normal.     Left Ear: Hearing normal.     Nose: Nose normal.  Eyes:     General: Lids are normal. No scleral icterus.       Right eye: No discharge.        Left eye: No discharge.     Conjunctiva/sclera: Conjunctivae normal.  Cardiovascular:     Rate and Rhythm: Normal rate and regular rhythm.     Heart sounds: Normal heart sounds.  Pulmonary:     Effort: Pulmonary effort is normal. No respiratory distress.  Skin:    Findings: No lesion or rash.  Neurological:     General: No focal deficit present.     Mental Status: She is  alert and oriented to person, place, and time.  Psychiatric:        Mood and Affect: Mood normal.        Speech: Speech normal.        Behavior: Behavior normal.        Thought Content: Thought content normal.        Judgment: Judgment normal.       No results found for any visits on 05/30/22.  Assessment & Plan     1. Primary hypertension Increase dose of losartan From 25 to 50 mg for simplicity.  If that does not work in a few months we will consider adding amlodipine or Dyazide - CBC with Differential/Platelet - Comprehensive metabolic panel - TSH  2. Adult hypothyroidism  - TSH  3. Hypercholesteremia  - Lipid Panel With LDL/HDL Ratio   No follow-ups on file.      I, Wilhemena Durie, MD, have reviewed all documentation for this visit. The documentation on 05/30/22 for the exam, diagnosis, procedures, and orders are all accurate and complete.    Richard Cranford Mon, MD  [facility] [phone] (phone) [phone] (fax)  [place]

## 2022-05-30 ENCOUNTER — Ambulatory Visit (INDEPENDENT_AMBULATORY_CARE_PROVIDER_SITE_OTHER): Payer: Medicare HMO | Admitting: Family Medicine

## 2022-05-30 VITALS — BP 122/85 | HR 81 | Temp 97.5°F | Wt 123.0 lb

## 2022-05-30 DIAGNOSIS — E039 Hypothyroidism, unspecified: Secondary | ICD-10-CM

## 2022-05-30 DIAGNOSIS — I10 Essential (primary) hypertension: Secondary | ICD-10-CM | POA: Diagnosis not present

## 2022-05-30 DIAGNOSIS — E7800 Pure hypercholesterolemia, unspecified: Secondary | ICD-10-CM | POA: Diagnosis not present

## 2022-05-30 MED ORDER — LOSARTAN POTASSIUM 50 MG PO TABS: 50.0000 mg | ORAL_TABLET | Freq: Every day | ORAL | 3 refills | Status: AC

## 2022-05-31 LAB — CBC WITH DIFFERENTIAL/PLATELET
Basophils Absolute: 0.1 10*3/uL (ref 0.0–0.2)
Basos: 2 %
EOS (ABSOLUTE): 0.2 10*3/uL (ref 0.0–0.4)
Eos: 4 %
Hematocrit: 44.8 % (ref 34.0–46.6)
Hemoglobin: 15.5 g/dL (ref 11.1–15.9)
Immature Grans (Abs): 0 10*3/uL (ref 0.0–0.1)
Immature Granulocytes: 0 %
Lymphocytes Absolute: 2.1 10*3/uL (ref 0.7–3.1)
Lymphs: 34 %
MCH: 32.4 pg (ref 26.6–33.0)
MCHC: 34.6 g/dL (ref 31.5–35.7)
MCV: 94 fL (ref 79–97)
Monocytes Absolute: 0.6 10*3/uL (ref 0.1–0.9)
Monocytes: 10 %
Neutrophils Absolute: 3 10*3/uL (ref 1.4–7.0)
Neutrophils: 50 %
Platelets: 313 10*3/uL (ref 150–450)
RBC: 4.78 x10E6/uL (ref 3.77–5.28)
RDW: 12.2 % (ref 11.7–15.4)
WBC: 6 10*3/uL (ref 3.4–10.8)

## 2022-05-31 LAB — COMPREHENSIVE METABOLIC PANEL
ALT: 11 IU/L (ref 0–32)
AST: 18 IU/L (ref 0–40)
Albumin/Globulin Ratio: 1.6 (ref 1.2–2.2)
Albumin: 4.2 g/dL (ref 3.8–4.8)
Alkaline Phosphatase: 96 IU/L (ref 44–121)
BUN/Creatinine Ratio: 17 (ref 12–28)
BUN: 16 mg/dL (ref 8–27)
Bilirubin Total: 0.5 mg/dL (ref 0.0–1.2)
CO2: 22 mmol/L (ref 20–29)
Calcium: 9.7 mg/dL (ref 8.7–10.3)
Chloride: 102 mmol/L (ref 96–106)
Creatinine, Ser: 0.95 mg/dL (ref 0.57–1.00)
Globulin, Total: 2.6 g/dL (ref 1.5–4.5)
Glucose: 93 mg/dL (ref 70–99)
Potassium: 4.7 mmol/L (ref 3.5–5.2)
Sodium: 140 mmol/L (ref 134–144)
Total Protein: 6.8 g/dL (ref 6.0–8.5)
eGFR: 61 mL/min/{1.73_m2} (ref >59)

## 2022-05-31 LAB — LIPID PANEL WITH LDL/HDL RATIO
Cholesterol, Total: 198 mg/dL (ref 100–199)
HDL: 72 mg/dL (ref >39)
LDL Chol Calc (NIH): 110 mg/dL — ABNORMAL HIGH (ref 0–99)
LDL/HDL Ratio: 1.5 ratio (ref 0.0–3.2)
Triglycerides: 89 mg/dL (ref 0–149)
VLDL Cholesterol Cal: 16 mg/dL (ref 5–40)

## 2022-05-31 LAB — TSH: TSH: 0.043 u[IU]/mL — ABNORMAL LOW (ref 0.450–4.500)

## 2022-07-04 DIAGNOSIS — D2262 Melanocytic nevi of left upper limb, including shoulder: Secondary | ICD-10-CM | POA: Diagnosis not present

## 2022-07-04 DIAGNOSIS — L570 Actinic keratosis: Secondary | ICD-10-CM | POA: Diagnosis not present

## 2022-07-04 DIAGNOSIS — Z85828 Personal history of other malignant neoplasm of skin: Secondary | ICD-10-CM | POA: Diagnosis not present

## 2022-07-04 DIAGNOSIS — D2271 Melanocytic nevi of right lower limb, including hip: Secondary | ICD-10-CM | POA: Diagnosis not present

## 2022-07-04 DIAGNOSIS — X32XXXA Exposure to sunlight, initial encounter: Secondary | ICD-10-CM | POA: Diagnosis not present

## 2022-07-04 DIAGNOSIS — D2261 Melanocytic nevi of right upper limb, including shoulder: Secondary | ICD-10-CM | POA: Diagnosis not present

## 2022-08-09 ENCOUNTER — Other Ambulatory Visit: Payer: Self-pay | Admitting: Family Medicine

## 2022-08-09 DIAGNOSIS — E039 Hypothyroidism, unspecified: Secondary | ICD-10-CM

## 2022-08-30 ENCOUNTER — Ambulatory Visit: Payer: Medicare HMO | Admitting: Family Medicine

## 2022-09-26 DIAGNOSIS — L858 Other specified epidermal thickening: Secondary | ICD-10-CM | POA: Diagnosis not present

## 2022-09-26 DIAGNOSIS — D485 Neoplasm of uncertain behavior of skin: Secondary | ICD-10-CM | POA: Diagnosis not present

## 2022-10-24 DIAGNOSIS — C44622 Squamous cell carcinoma of skin of right upper limb, including shoulder: Secondary | ICD-10-CM | POA: Diagnosis not present

## 2022-10-24 DIAGNOSIS — L858 Other specified epidermal thickening: Secondary | ICD-10-CM | POA: Diagnosis not present

## 2022-10-24 DIAGNOSIS — L578 Other skin changes due to chronic exposure to nonionizing radiation: Secondary | ICD-10-CM | POA: Diagnosis not present

## 2022-10-24 DIAGNOSIS — L988 Other specified disorders of the skin and subcutaneous tissue: Secondary | ICD-10-CM | POA: Diagnosis not present

## 2022-10-24 DIAGNOSIS — Z85828 Personal history of other malignant neoplasm of skin: Secondary | ICD-10-CM | POA: Diagnosis not present

## 2023-01-09 DIAGNOSIS — E039 Hypothyroidism, unspecified: Secondary | ICD-10-CM | POA: Diagnosis not present

## 2023-01-09 DIAGNOSIS — I10 Essential (primary) hypertension: Secondary | ICD-10-CM | POA: Diagnosis not present

## 2023-01-09 DIAGNOSIS — E7800 Pure hypercholesterolemia, unspecified: Secondary | ICD-10-CM | POA: Diagnosis not present

## 2023-01-22 ENCOUNTER — Other Ambulatory Visit: Payer: Self-pay | Admitting: Physician Assistant

## 2023-01-22 DIAGNOSIS — E039 Hypothyroidism, unspecified: Secondary | ICD-10-CM

## 2023-02-02 IMAGING — MG MM DIGITAL DIAGNOSTIC UNILAT*R* W/ TOMO W/ CAD
4 series · 4 of 12 positions shown · non-contrast
Comparison: Previous baseline mammogram

CLINICAL DATA: Callback for RIGHT breast asymmetry on baseline
mammogram.

EXAM:
DIGITAL DIAGNOSTIC UNILATERAL RIGHT MAMMOGRAM WITH TOMOSYNTHESIS AND
CAD; ULTRASOUND RIGHT BREAST LIMITED
TECHNIQUE: Right digital diagnostic mammography and breast tomosynthesis was
performed. The images were evaluated with computer-aided detection.;
Targeted ultrasound examination of the right breast was performed

[R ML synth-2D]
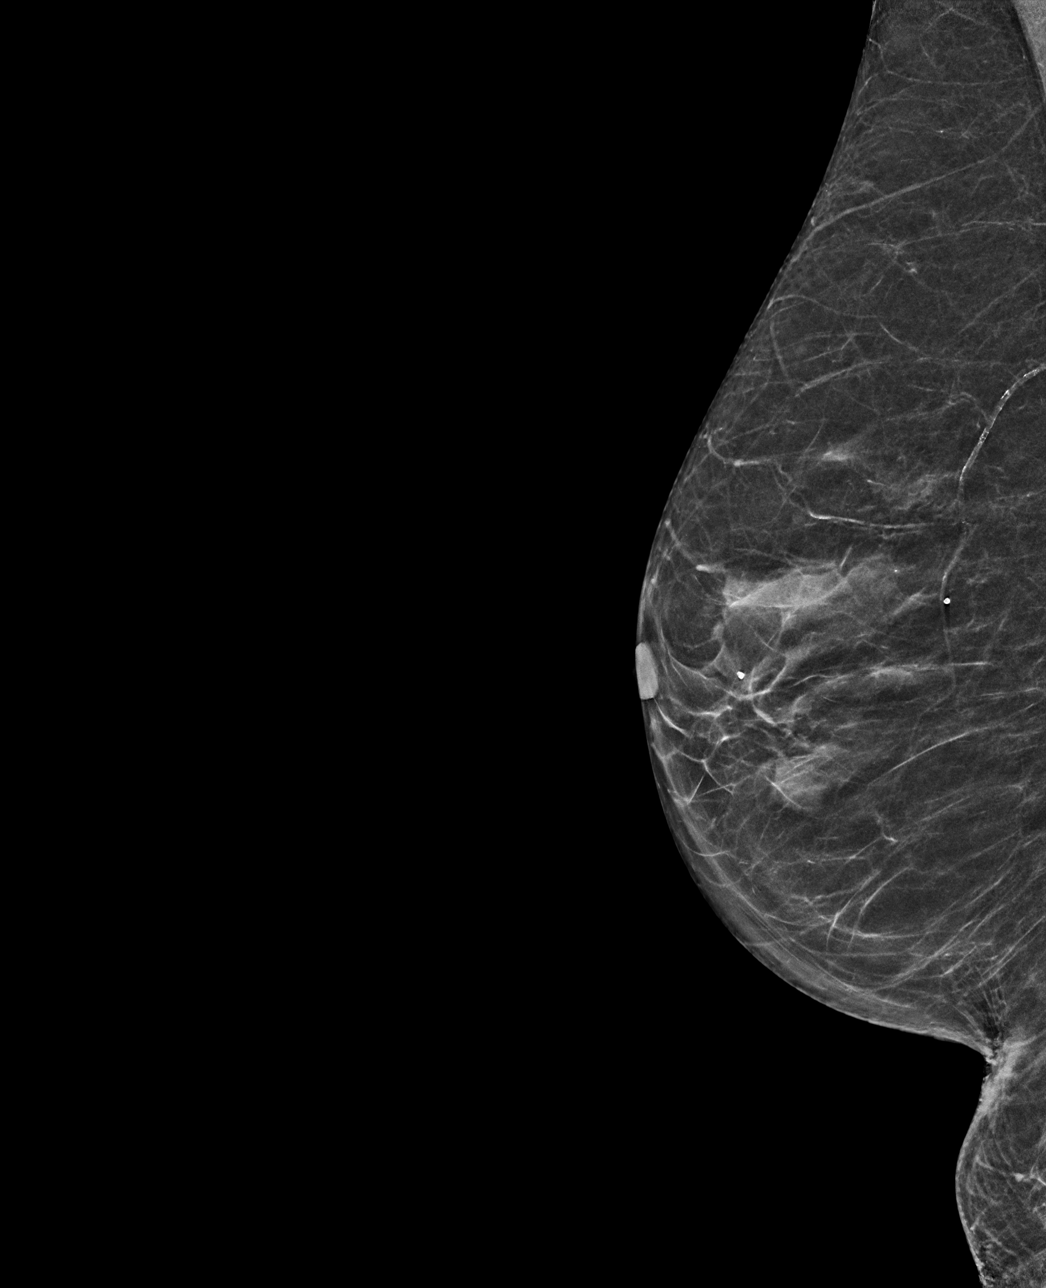

[R MLO synth-2D]
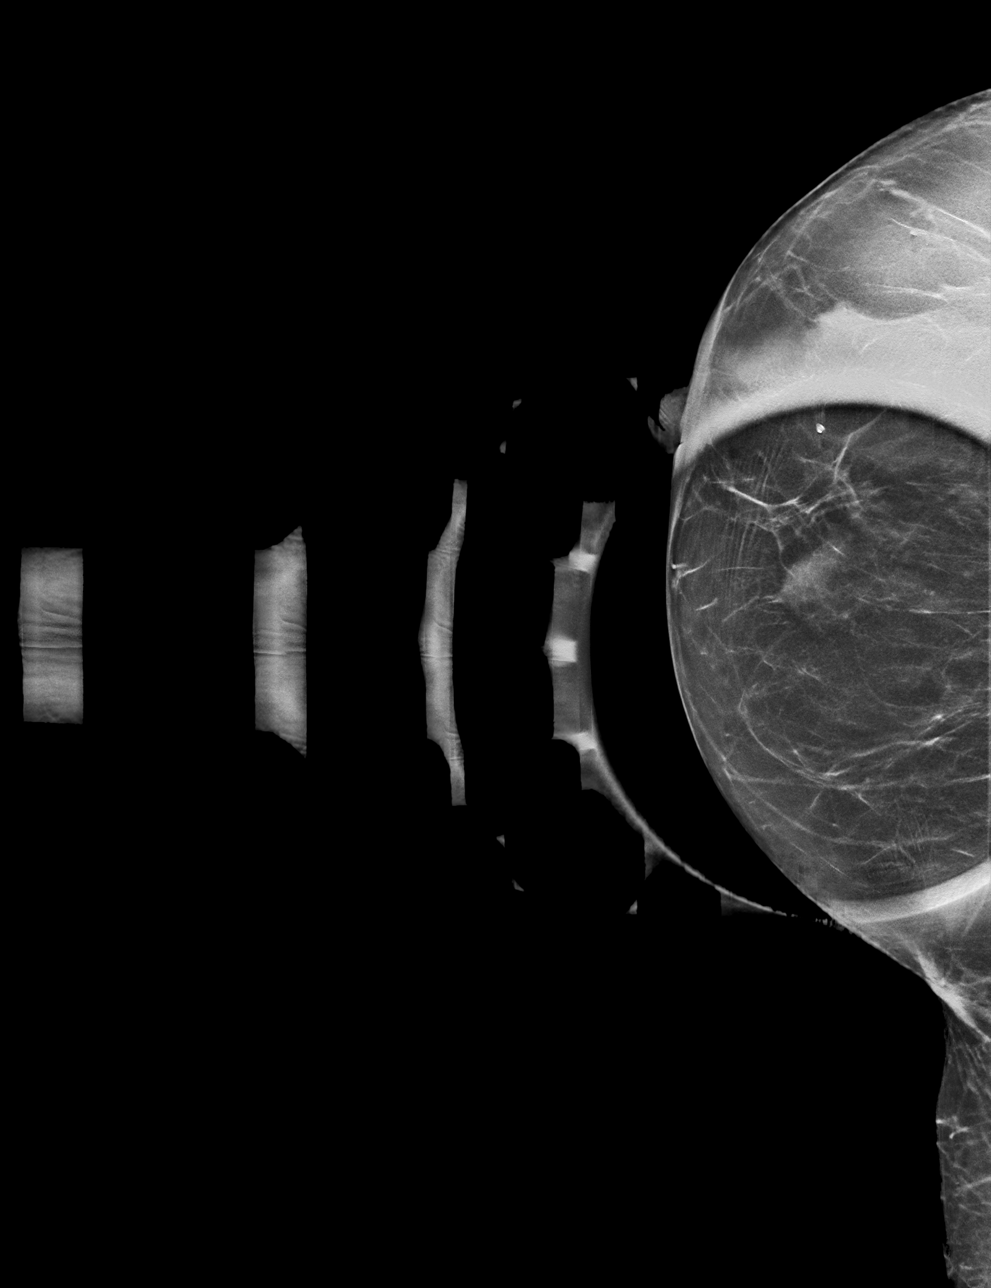

[R MLO tomo · tomo slice 25/50.0]
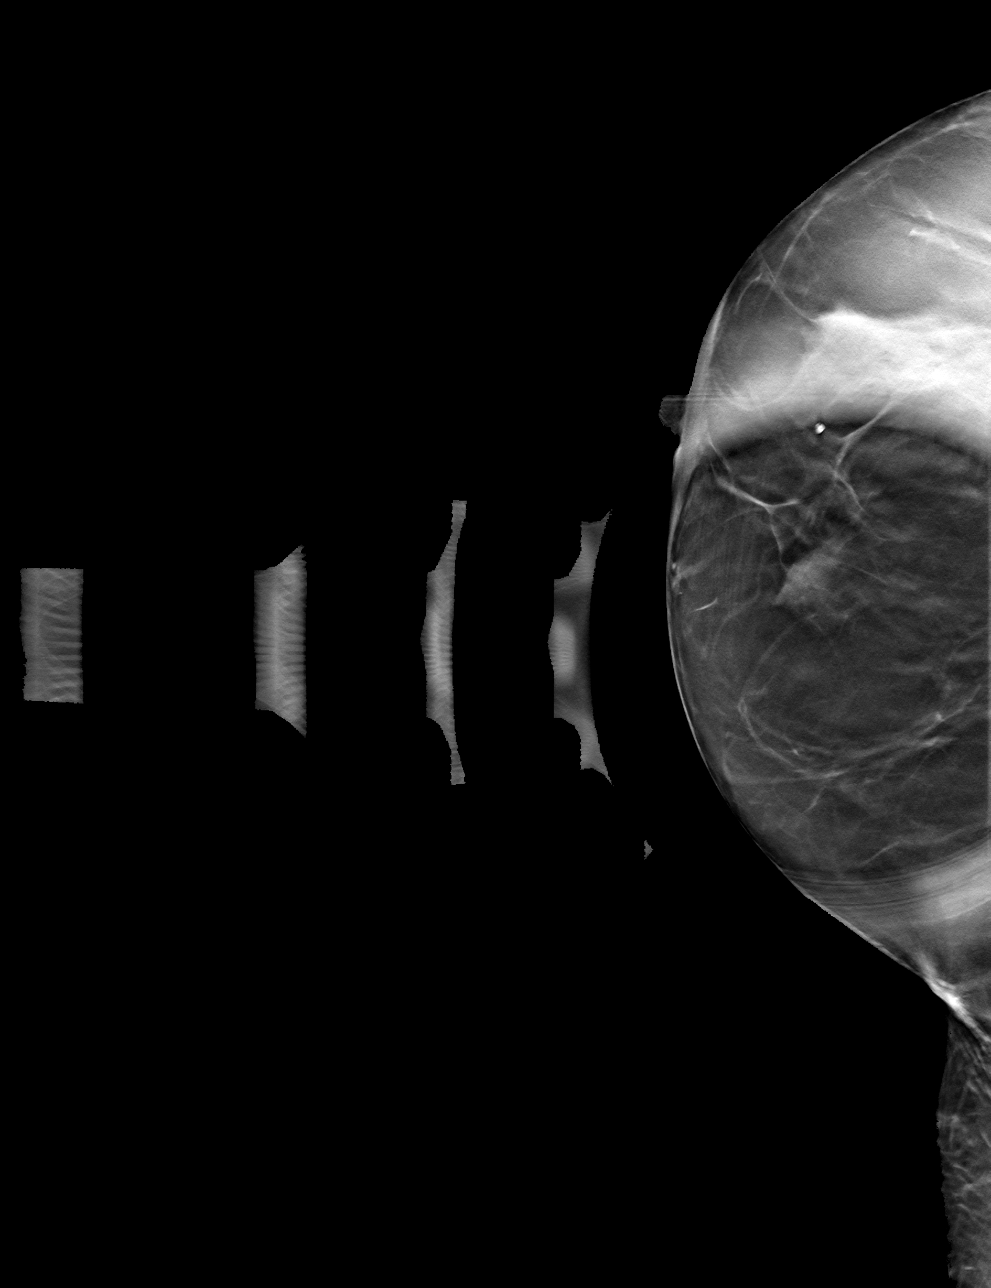

[R ML tomo · tomo slice 29/58.0]
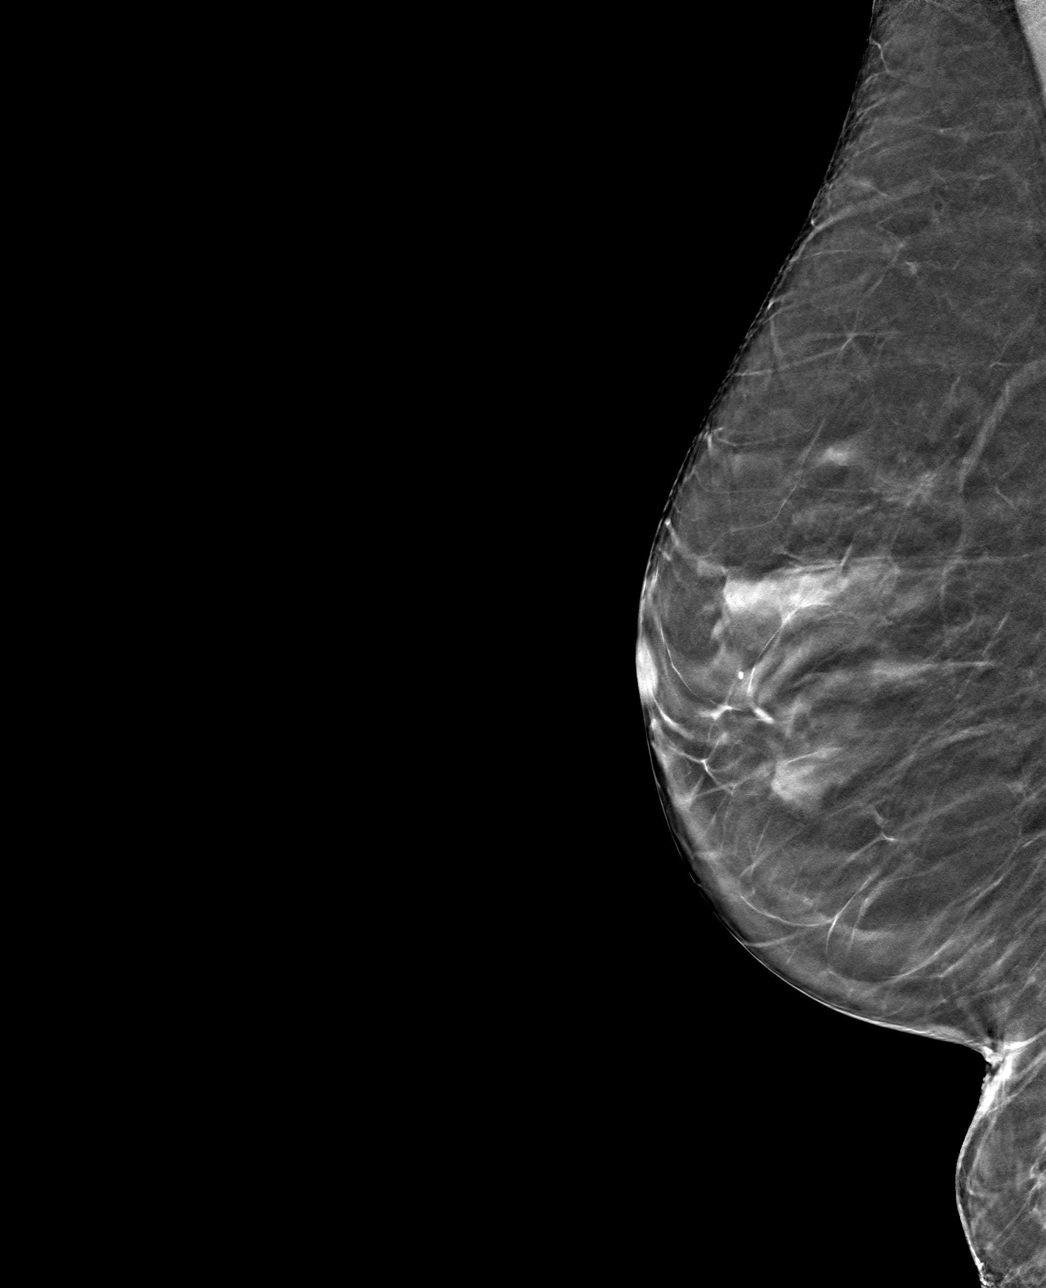

[4 of 12 positions shown; findings below may reference images not displayed]

ACR Breast Density Category b: There are scattered areas of
fibroglandular density.
FINDINGS: Spot compression tomosynthesis views confirm persistence of an
asymmetry in the RIGHT lower breast at anterior depth. There is
internal interdigitating fat; this likely reflects baseline
configuration of fibroglandular tissue. No additional suspicious
findings noted in the RIGHT breast.

On physical exam, no suspicious mass is appreciated.

Targeted ultrasound was performed of the RIGHT lower breast. No
suspicious cystic or solid mass is seen. An island of fibroglandular
tissue is noted.
IMPRESSION: 1. There is a probably benign focal asymmetry in the RIGHT lower
breast. This likely reflects patient's baseline configuration of
fibroglandular tissue. Recommend diagnostic mammogram with RIGHT
breast ultrasound if deemed necessary in 6 months. This will
establish 6 months of definitive stability.

RECOMMENDATION:
RIGHT diagnostic mammogram with ultrasound if deemed necessary in 6
months.

I have discussed the findings and recommendations with the patient.
If applicable, a reminder letter will be sent to the patient
regarding the next appointment.

BI-RADS CATEGORY  3: Probably benign.

## 2023-04-12 ENCOUNTER — Other Ambulatory Visit: Payer: Self-pay | Admitting: Physician Assistant

## 2023-04-12 NOTE — Telephone Encounter (Signed)
Requested Prescriptions  Refused Prescriptions Disp Refills   rosuvastatin (CRESTOR) 10 MG tablet [Pharmacy Med Name: ROSUVASTATIN CALCIUM 10 MG TAB] 90 tablet 3    Sig: TAKE 1 TABLET BY MOUTH DAILY     Cardiovascular:  Antilipid - Statins 2 Failed - 04/12/2023  3:59 PM      Failed - Lipid Panel in normal range within the last 12 months    Cholesterol, Total  Date Value Ref Range Status  05/30/2022 198 100 - 199 mg/dL Final   LDL Chol Calc (NIH)  Date Value Ref Range Status  05/30/2022 110 (H) 0 - 99 mg/dL Final   HDL  Date Value Ref Range Status  05/30/2022 72 >39 mg/dL Final   Triglycerides  Date Value Ref Range Status  05/30/2022 89 0 - 149 mg/dL Final         Passed - Cr in normal range and within 360 days    Creatinine, Ser  Date Value Ref Range Status  05/30/2022 0.95 0.57 - 1.00 mg/dL Final         Passed - Patient is not pregnant      Passed - Valid encounter within last 12 months    Recent Outpatient Visits           10 months ago Primary hypertension   [facility] Bosie Clos, MD   1 year ago Primary hypertension    [facility] Bosie Clos, MD   1 year ago Encounter for annual wellness exam in Medicare patient   [facility] Bosie Clos, MD   2 years ago Exposure to COVID-19 virus   [facility] [place], Jodell Cipro, PA-C   3 years ago Encounter for annual physical exam   [facility] Bosie Clos, MD

## 2023-04-19 DIAGNOSIS — I10 Essential (primary) hypertension: Secondary | ICD-10-CM | POA: Diagnosis not present

## 2023-04-19 DIAGNOSIS — Z1231 Encounter for screening mammogram for malignant neoplasm of breast: Secondary | ICD-10-CM | POA: Diagnosis not present

## 2023-04-19 DIAGNOSIS — Z0000 Encounter for general adult medical examination without abnormal findings: Secondary | ICD-10-CM | POA: Diagnosis not present

## 2023-04-19 DIAGNOSIS — E039 Hypothyroidism, unspecified: Secondary | ICD-10-CM | POA: Diagnosis not present

## 2023-04-19 DIAGNOSIS — E7800 Pure hypercholesterolemia, unspecified: Secondary | ICD-10-CM | POA: Diagnosis not present

## 2023-04-19 DIAGNOSIS — R42 Dizziness and giddiness: Secondary | ICD-10-CM | POA: Diagnosis not present

## 2023-04-29 DIAGNOSIS — E7800 Pure hypercholesterolemia, unspecified: Secondary | ICD-10-CM | POA: Diagnosis not present

## 2023-04-29 DIAGNOSIS — R42 Dizziness and giddiness: Secondary | ICD-10-CM | POA: Diagnosis not present

## 2023-04-29 DIAGNOSIS — Z1231 Encounter for screening mammogram for malignant neoplasm of breast: Secondary | ICD-10-CM | POA: Diagnosis not present

## 2023-04-29 DIAGNOSIS — E039 Hypothyroidism, unspecified: Secondary | ICD-10-CM | POA: Diagnosis not present

## 2023-04-29 DIAGNOSIS — Z0000 Encounter for general adult medical examination without abnormal findings: Secondary | ICD-10-CM | POA: Diagnosis not present

## 2023-04-29 DIAGNOSIS — I10 Essential (primary) hypertension: Secondary | ICD-10-CM | POA: Diagnosis not present

## 2023-06-06 DIAGNOSIS — I10 Essential (primary) hypertension: Secondary | ICD-10-CM | POA: Diagnosis not present

## 2023-06-06 DIAGNOSIS — E039 Hypothyroidism, unspecified: Secondary | ICD-10-CM | POA: Diagnosis not present

## 2023-06-06 DIAGNOSIS — E7800 Pure hypercholesterolemia, unspecified: Secondary | ICD-10-CM | POA: Diagnosis not present

## 2023-07-26 DIAGNOSIS — L570 Actinic keratosis: Secondary | ICD-10-CM | POA: Diagnosis not present

## 2023-07-26 DIAGNOSIS — D045 Carcinoma in situ of skin of trunk: Secondary | ICD-10-CM | POA: Diagnosis not present

## 2023-07-26 DIAGNOSIS — Z08 Encounter for follow-up examination after completed treatment for malignant neoplasm: Secondary | ICD-10-CM | POA: Diagnosis not present

## 2023-07-26 DIAGNOSIS — L821 Other seborrheic keratosis: Secondary | ICD-10-CM | POA: Diagnosis not present

## 2023-07-26 DIAGNOSIS — D225 Melanocytic nevi of trunk: Secondary | ICD-10-CM | POA: Diagnosis not present

## 2023-07-26 DIAGNOSIS — D044 Carcinoma in situ of skin of scalp and neck: Secondary | ICD-10-CM | POA: Diagnosis not present

## 2023-07-26 DIAGNOSIS — D485 Neoplasm of uncertain behavior of skin: Secondary | ICD-10-CM | POA: Diagnosis not present

## 2023-07-26 DIAGNOSIS — Z85828 Personal history of other malignant neoplasm of skin: Secondary | ICD-10-CM | POA: Diagnosis not present

## 2023-08-21 DIAGNOSIS — D045 Carcinoma in situ of skin of trunk: Secondary | ICD-10-CM | POA: Diagnosis not present

## 2023-11-13 DIAGNOSIS — L570 Actinic keratosis: Secondary | ICD-10-CM | POA: Diagnosis not present

## 2023-11-13 DIAGNOSIS — D0439 Carcinoma in situ of skin of other parts of face: Secondary | ICD-10-CM | POA: Diagnosis not present

## 2023-12-03 DIAGNOSIS — Z03818 Encounter for observation for suspected exposure to other biological agents ruled out: Secondary | ICD-10-CM | POA: Diagnosis not present

## 2023-12-03 DIAGNOSIS — J101 Influenza due to other identified influenza virus with other respiratory manifestations: Secondary | ICD-10-CM | POA: Diagnosis not present

## 2023-12-03 DIAGNOSIS — J188 Other pneumonia, unspecified organism: Secondary | ICD-10-CM | POA: Diagnosis not present

## 2024-03-31 DIAGNOSIS — E039 Hypothyroidism, unspecified: Secondary | ICD-10-CM | POA: Diagnosis not present

## 2024-03-31 DIAGNOSIS — M25561 Pain in right knee: Secondary | ICD-10-CM | POA: Diagnosis not present

## 2024-03-31 DIAGNOSIS — G8929 Other chronic pain: Secondary | ICD-10-CM | POA: Diagnosis not present

## 2024-04-17 DIAGNOSIS — D2262 Melanocytic nevi of left upper limb, including shoulder: Secondary | ICD-10-CM | POA: Diagnosis not present

## 2024-04-17 DIAGNOSIS — L821 Other seborrheic keratosis: Secondary | ICD-10-CM | POA: Diagnosis not present

## 2024-04-17 DIAGNOSIS — Z85828 Personal history of other malignant neoplasm of skin: Secondary | ICD-10-CM | POA: Diagnosis not present

## 2024-04-17 DIAGNOSIS — H61031 Chondritis of right external ear: Secondary | ICD-10-CM | POA: Diagnosis not present

## 2024-04-17 DIAGNOSIS — L2089 Other atopic dermatitis: Secondary | ICD-10-CM | POA: Diagnosis not present

## 2024-04-17 DIAGNOSIS — D225 Melanocytic nevi of trunk: Secondary | ICD-10-CM | POA: Diagnosis not present

## 2024-04-17 DIAGNOSIS — D2272 Melanocytic nevi of left lower limb, including hip: Secondary | ICD-10-CM | POA: Diagnosis not present

## 2024-04-17 DIAGNOSIS — L570 Actinic keratosis: Secondary | ICD-10-CM | POA: Diagnosis not present

## 2024-04-17 DIAGNOSIS — D2271 Melanocytic nevi of right lower limb, including hip: Secondary | ICD-10-CM | POA: Diagnosis not present

## 2024-04-17 DIAGNOSIS — D2261 Melanocytic nevi of right upper limb, including shoulder: Secondary | ICD-10-CM | POA: Diagnosis not present

## 2024-04-17 DIAGNOSIS — Z08 Encounter for follow-up examination after completed treatment for malignant neoplasm: Secondary | ICD-10-CM | POA: Diagnosis not present

## 2024-09-11 ENCOUNTER — Other Ambulatory Visit: Payer: Self-pay | Admitting: Family Medicine

## 2024-09-11 DIAGNOSIS — Z1231 Encounter for screening mammogram for malignant neoplasm of breast: Secondary | ICD-10-CM

## 2024-09-14 ENCOUNTER — Encounter: Payer: Self-pay | Admitting: Family Medicine

## 2024-09-16 ENCOUNTER — Other Ambulatory Visit: Payer: Self-pay | Admitting: Family Medicine

## 2024-09-16 DIAGNOSIS — Z1231 Encounter for screening mammogram for malignant neoplasm of breast: Secondary | ICD-10-CM

## 2024-09-16 DIAGNOSIS — N6489 Other specified disorders of breast: Secondary | ICD-10-CM

## 2024-11-12 ENCOUNTER — Encounter

## 2024-11-12 ENCOUNTER — Other Ambulatory Visit

## 2024-12-01 ENCOUNTER — Encounter

## 2024-12-01 ENCOUNTER — Other Ambulatory Visit

## 2024-12-07 ENCOUNTER — Encounter: Payer: Self-pay | Admitting: *Deleted
